# Patient Record
Sex: Male | Born: 1975
Health system: Southern US, Community
[De-identification: ages and names within clinical notes are randomized; demographics above are authoritative.]

## PROBLEM LIST (undated history)

## (undated) DIAGNOSIS — I2510 Atherosclerotic heart disease of native coronary artery without angina pectoris: Secondary | ICD-10-CM

## (undated) DIAGNOSIS — N200 Calculus of kidney: Secondary | ICD-10-CM

## (undated) DIAGNOSIS — E785 Hyperlipidemia, unspecified: Secondary | ICD-10-CM

## (undated) DIAGNOSIS — I10 Essential (primary) hypertension: Secondary | ICD-10-CM

## (undated) DIAGNOSIS — R079 Chest pain, unspecified: Secondary | ICD-10-CM

## (undated) HISTORY — DX: Atherosclerotic heart disease of native coronary artery without angina pectoris: I25.10

## (undated) HISTORY — DX: Essential (primary) hypertension: I10

## (undated) HISTORY — DX: Calculus of kidney: N20.0

## (undated) HISTORY — DX: Chest pain, unspecified: R07.9

## (undated) HISTORY — DX: Hyperlipidemia, unspecified: E78.5

---

## 2018-01-15 ENCOUNTER — Ambulatory Visit (INDEPENDENT_AMBULATORY_CARE_PROVIDER_SITE_OTHER): Payer: BLUE CROSS/BLUE SHIELD

## 2018-01-15 ENCOUNTER — Encounter: Payer: Self-pay | Admitting: Physician Assistant

## 2018-01-15 ENCOUNTER — Ambulatory Visit (INDEPENDENT_AMBULATORY_CARE_PROVIDER_SITE_OTHER): Payer: BLUE CROSS/BLUE SHIELD | Admitting: Physician Assistant

## 2018-01-15 VITALS — BP 110/86 | HR 84 | Temp 98.2°F | Resp 16 | Ht 69.0 in | Wt 231.0 lb

## 2018-01-15 DIAGNOSIS — Z1329 Encounter for screening for other suspected endocrine disorder: Secondary | ICD-10-CM

## 2018-01-15 DIAGNOSIS — Z130 Encounter for screening for diseases of the blood and blood-forming organs and certain disorders involving the immune mechanism: Secondary | ICD-10-CM | POA: Diagnosis not present

## 2018-01-15 DIAGNOSIS — Z13220 Encounter for screening for lipoid disorders: Secondary | ICD-10-CM | POA: Diagnosis not present

## 2018-01-15 DIAGNOSIS — Z0000 Encounter for general adult medical examination without abnormal findings: Secondary | ICD-10-CM

## 2018-01-15 DIAGNOSIS — R079 Chest pain, unspecified: Secondary | ICD-10-CM

## 2018-01-15 LAB — POCT URINALYSIS DIP (MANUAL ENTRY)
Bilirubin, UA: NEGATIVE
Blood, UA: NEGATIVE
Glucose, UA: NEGATIVE mg/dL
Ketones, POC UA: NEGATIVE mg/dL
Leukocytes, UA: NEGATIVE
Nitrite, UA: NEGATIVE
Protein Ur, POC: NEGATIVE mg/dL
Spec Grav, UA: 1.01 (ref 1.010–1.025)
Urobilinogen, UA: 0.2 E.U./dL
pH, UA: 6.5 (ref 5.0–8.0)

## 2018-01-15 NOTE — Patient Instructions (Addendum)
Your EKG and chest x-ray look fine. Please come back if your symptoms worsen.  Your weight today is 231 lbs. I recommend setting a goal to lose 30 lbs. This may help alleviate your symptoms.  We will contact you with the results from today's blood work when they come back.   Please make an appointment to have your teeth cleaned by a dentist.    Thank you for coming in today. I hope you feel we met your needs.  Feel free to call PCP if you have any questions or further requests.  Please consider signing up for MyChart if you do not already have it, as this is a great way to communicate with me.  Best,  Mercer Pod, PA-C   Health Maintenance, Male A healthy lifestyle and preventive care is important for your health and wellness. Ask your health care provider about what schedule of regular examinations is right for you. What should I know about weight and diet? Eat a Healthy Diet  Eat plenty of vegetables, fruits, whole grains, low-fat dairy products, and lean protein.  Do not eat a lot of foods high in solid fats, added sugars, or salt.  Maintain a Healthy Weight Regular exercise can help you achieve or maintain a healthy weight. You should:  Do at least 150 minutes of exercise each week. The exercise should increase your heart rate and make you sweat (moderate-intensity exercise).  Do strength-training exercises at least twice a week.  Watch Your Levels of Cholesterol and Blood Lipids  Have your blood tested for lipids and cholesterol e very 5 years starting at 42 years of age. If you are at high risk for heart disease, you should start having your blood tested when you are 42 years old. You may need to have your cholesterol levels checked more often if: ? Your lipid or cholesterol levels are high. ? You are older than 42 years of age. ? You are at high risk for heart disease.  What should I know about cancer screening? Many types of cancers can be detected early and may often  be prevented. Lung Cancer  You should be screened every year for lung cancer if: ? You are a current smoker who has smoked for at least 30 years. ? You are a former smoker who has quit within the past 15 years.  Talk to your health care provider about your screening options, when you should start screening, and how often you should be screened.  Colorectal Cancer  Routine colorectal cancer screening usually begins at 42 years of age and should be repeated every 5-10 years until you are 42 years old. You may need to be screened more often if early forms of precancerous polyps or small growths are found. Your health care provider may recommend screening at an earlier age if you have risk factors for colon cancer.  Your health care provider may recommend using home test kits to check for hidden blood in the stool.  A small camera at the end of a tube can be used to examine your colon (sigmoidoscopy or colonoscopy). This checks for the earliest forms of colorectal cancer.  Prostate and Testicular Cancer  Depending on your age and overall health, your health care provider may do certain tests to screen for prostate and testicular cancer.  Talk to your health care provider about any symptoms or concerns you have about testicular or prostate cancer.  Skin Cancer  Check your skin from head to toe regularly.  Tell your health care provider about any new moles or changes in moles, especially if: ? There is a change in a mole's size, shape, or color. ? You have a mole that is larger than a pencil eraser.  Always use sunscreen. Apply sunscreen liberally and repeat throughout the day.  Protect yourself by wearing long sleeves, pants, a wide-brimmed hat, and sunglasses when outside.  What should I know about heart disease, diabetes, and high blood pressure?  If you are 80-74 years of age, have your blood pressure checked every 3-5 years. If you are 58 years of age or older, have your blood  pressure checked every year. You should have your blood pressure measured twice-once when you are at a hospital or clinic, and once when you are not at a hospital or clinic. Record the average of the two measurements. To check your blood pressure when you are not at a hospital or clinic, you can use: ? An automated blood pressure machine at a pharmacy. ? A home blood pressure monitor.  Talk to your health care provider about your target blood pressure.  If you are between 22-45 years old, ask your health care provider if you should take aspirin to prevent heart disease.  Have regular diabetes screenings by checking your fasting blood sugar level. ? If you are at a normal weight and have a low risk for diabetes, have this test once every three years after the age of 60. ? If you are overweight and have a high risk for diabetes, consider being tested at a younger age or more often.  A one-time screening for abdominal aortic aneurysm (AAA) by ultrasound is recommended for men aged 39-75 years who are current or former smokers. What should I know about preventing infection? Hepatitis B If you have a higher risk for hepatitis B, you should be screened for this virus. Talk with your health care provider to find out if you are at risk for hepatitis B infection. Hepatitis C Blood testing is recommended for:  Everyone born from [birth year] through 1964.  Anyone with known risk factors for hepatitis C.  Sexually Transmitted Diseases (STDs)  You should be screened each year for STDs including gonorrhea and chlamydia if: ? You are sexually active and are younger than 42 years of age. ? You are older than 42 years of age and your health care provider tells you that you are at risk for this type of infection. ? Your sexual activity has changed since you were last screened and you are at an increased risk for chlamydia or gonorrhea. Ask your health care provider if you are at risk.  Talk with your health  care provider about whether you are at high risk of being infected with HIV. Your health care provider may recommend a prescription medicine to help prevent HIV infection.  What else can I do?  Schedule regular health, dental, and eye exams.  Stay current with your vaccines (immunizations).  Do not use any tobacco products, such as cigarettes, chewing tobacco, and e-cigarettes. If you need help quitting, ask your health care provider.  Limit alcohol intake to no more than 2 drinks per day. One drink equals 12 ounces of beer, 5 ounces of wine, or 1 ounces of hard liquor.  Do not use street drugs.  Do not share needles.  Ask your health care provider for help if you need support or information about quitting drugs.  Tell your health care provider if you often feel depressed.  Tell your health care provider if you have ever been abused or do not feel safe at home. This information is not intended to replace advice given to you by your health care provider. Make sure you discuss any questions you have with your health care provider. Document Released: 06/14/2008 Document Revised: 08/15/2016 Document Reviewed: 09/20/2015 Elsevier Interactive Patient Education  2018 [employer].  IF you received an x-ray today, you will receive an invoice from [facility]. Please contact [facility] at [phone] with questions or concerns regarding your invoice.   IF you received labwork today, you will receive an invoice from [facility]. Please contact [facility] at [phone] with questions or concerns regarding your invoice.   Our billing staff will not be able to assist you with questions regarding bills from these companies.  You will be contacted with the lab results as soon as they are available. The fastest way to get your results is to activate your My Chart account. Instructions are located on the last page of this paperwork. If you have not heard from [place] regarding the results  in 2 weeks, please contact this office.

## 2018-01-15 NOTE — Progress Notes (Signed)
[facility] [place], [place] [address] [phone]  Date:  01/15/2018   Name:  Leonard Henry   DOB:  [birth year]   MRN:  [id number]  PCP:  Patient, No Pcp Per    Chief Complaint: Annual Exam   History of Present Illness:  This is a 43 y.o. male with no reported PMH who is presenting for CPE. Never had a physical exam before. He works in [employer].   Complaints:  Chest pain x several years. Sometimes accompanied with left shoulder pain. Pain is currently present. Pain is located below left breast. Sometimes has shob with exertion. He has had an EKG several years ago for same symptoms, which was negative.  Immunizations: needs tdap Dentist: does not see dentist.  Eye: 20/20 b/l Diet: mostly vegetables, Indian breads. Once a week eats meat.  Exercise: does not exercise.  Fam hx: mother heart disease, HLD, DM; father heart disease,  Sexual hx: married Urinary hesitancy/frequency or nocturia: none Tobacco/alcohol/substance use: never smoker, does not drink alcohol or use drugs.   Review of Systems:  Review of Systems  Constitutional: Negative for activity change, appetite change, chills, diaphoresis and fatigue.  HENT: Negative for congestion, dental problem, sneezing and tinnitus.   Eyes: Negative for visual disturbance.  Respiratory: Positive for chest tightness and shortness of breath. Negative for cough and wheezing.   Cardiovascular: Positive for chest pain. Negative for palpitations and leg swelling.  Gastrointestinal: Negative for abdominal pain, blood in stool, constipation, diarrhea, nausea and vomiting.  Endocrine: Negative for polydipsia, polyphagia and polyuria.  Genitourinary: Negative for decreased urine volume, difficulty urinating, discharge, hematuria, scrotal swelling and testicular pain.  Musculoskeletal: Negative for arthralgias, back pain, neck pain and neck stiffness.  Allergic/Immunologic: Negative for environmental allergies and food  allergies.  Neurological: Negative for dizziness, syncope, weakness, light-headedness and headaches.  Psychiatric/Behavioral: Negative for sleep disturbance. The patient is not nervous/anxious.     There are no active problems to display for this patient.   Prior to Admission medications   Not on File    Allergies not on file  Social History   Tobacco Use  . Smoking status: Never Smoker  . Smokeless tobacco: Never Used  Substance Use Topics  . Alcohol use: Yes  . Drug use: No    Family History  Problem Relation Age of Onset  . Diabetes Mother   . Heart disease Mother   . Hyperlipidemia Mother   . Heart disease Father     Medication list has been reviewed and updated.  Physical Examination:  Physical Exam  Constitutional: He is oriented to person, place, and time. Vital signs are normal. He appears well-developed and well-nourished. No distress.  obese  HENT:  Head: Normocephalic and atraumatic.  Right Ear: External ear normal.  Left Ear: External ear normal.  Nose: Nose normal.  Mouth/Throat: No oropharyngeal exudate.  Eyes: Conjunctivae and EOM are normal. Pupils are equal, round, and reactive to light.  Neck: Normal range of motion. No thyromegaly present.  Cardiovascular: Normal rate, regular rhythm, S1 normal, S2 normal, normal heart sounds and intact distal pulses.  No murmur heard. Distant heart sounds.   Pulmonary/Chest: Effort normal and breath sounds normal. No respiratory distress. He has no wheezes. He exhibits no mass, no tenderness, no bony tenderness and no deformity.  Mildly prominent breast tissue b/l.  Chest is not TTP.   Abdominal: Soft. Bowel sounds are normal. He exhibits no distension and no mass. There is no tenderness.  Central obesity  Musculoskeletal: Normal range of motion. He exhibits no edema.  Lymphadenopathy:    He has no cervical adenopathy.  Neurological: He is alert and oriented to person, place, and time. He has normal  reflexes.  Skin: Skin is warm and dry.  Psychiatric: He has a normal mood and affect. His behavior is normal. Judgment and thought content normal.  Vitals reviewed.   BP 110/86   Pulse 84   Temp 98.2 F (36.8 C) (Oral)   Resp 16   Ht _0  (1.753 m)   Wt 231 lb (104.8 kg)   SpO2 97%   BMI 34.11 kg/m   Dg Chest 2 View  Result Date: 01/15/2018 CLINICAL DATA:  Chest pain for 1 year EXAM: CHEST  2 VIEW COMPARISON:  None. FINDINGS: Mild opacity at the left cardiac apex. No consolidation or effusion. Low lung volumes. Normal heart size. No pneumothorax IMPRESSION: Minimal opacity at the cardiac apex could be due to atelectasis, scar, or prominent fat pad. Low lung volumes. No definite radiographic evidence for acute cardiopulmonary abnormality. Electronically Signed   By: Donavan Foil M.D.   On: 01/15/2018 17:25    EKG reviewed by Dr. Brigitte Pulse. Shows low voltage of precordial waves. No ST abnormalities.  Assessment and Plan: 1. Annual physical exam - Pt presents for first ever annual exam. C/o chest pain x 1 year. No concerning findings on EKG. Chest x-ray +low lung volumes, no other abnormalities. Advised weight loss. Anticipatory guidance provided. Routine labs are pending.   2. Screening, lipid - Lipid panel  3. Screening for deficiency anemia - CBC with Differential/Platelet  4. Screening for endocrine disorder - CMP14+EGFR - POCT urinalysis dipstick  5. Chest pain, unspecified type - EKG 12-Lead - DG Chest 2 View; Future - Check Peak Flow - EKG is not concerning today. Peak flow is 532. Pt requests cardiac referral as he has been having chest pain x several years.   Mercer Pod, PA-C  [facility] [place] 01/15/2018 4:06 PM

## 2018-01-16 LAB — CBC WITH DIFFERENTIAL/PLATELET
Basophils Absolute: 0.1 10*3/uL (ref 0.0–0.2)
Basos: 1 %
EOS (ABSOLUTE): 0.3 10*3/uL (ref 0.0–0.4)
Eos: 4 %
Hematocrit: 44.6 % (ref 37.5–51.0)
Hemoglobin: 15.3 g/dL (ref 13.0–17.7)
Immature Grans (Abs): 0 10*3/uL (ref 0.0–0.1)
Immature Granulocytes: 0 %
Lymphocytes Absolute: 2.4 10*3/uL (ref 0.7–3.1)
Lymphs: 33 %
MCH: 29 pg (ref 26.6–33.0)
MCHC: 34.3 g/dL (ref 31.5–35.7)
MCV: 85 fL (ref 79–97)
Monocytes Absolute: 0.5 10*3/uL (ref 0.1–0.9)
Monocytes: 7 %
Neutrophils Absolute: 4.2 10*3/uL (ref 1.4–7.0)
Neutrophils: 55 %
Platelets: 249 10*3/uL (ref 150–379)
RBC: 5.27 x10E6/uL (ref 4.14–5.80)
RDW: 14.4 % (ref 12.3–15.4)
WBC: 7.4 10*3/uL (ref 3.4–10.8)

## 2018-01-16 LAB — CMP14+EGFR
ALT: 21 IU/L (ref 0–44)
AST: 23 IU/L (ref 0–40)
Albumin/Globulin Ratio: 1.6 (ref 1.2–2.2)
Albumin: 4.3 g/dL (ref 3.5–5.5)
Alkaline Phosphatase: 72 IU/L (ref 39–117)
BUN/Creatinine Ratio: 11 (ref 9–20)
BUN: 10 mg/dL (ref 6–24)
Bilirubin Total: 0.7 mg/dL (ref 0.0–1.2)
CO2: 21 mmol/L (ref 20–29)
Calcium: 9.9 mg/dL (ref 8.7–10.2)
Chloride: 105 mmol/L (ref 96–106)
Creatinine, Ser: 0.94 mg/dL (ref 0.76–1.27)
GFR calc Af Amer: 115 mL/min/{1.73_m2} (ref >59)
GFR calc non Af Amer: 100 mL/min/{1.73_m2} (ref >59)
Globulin, Total: 2.7 g/dL (ref 1.5–4.5)
Glucose: 108 mg/dL — ABNORMAL HIGH (ref 65–99)
Potassium: 4.9 mmol/L (ref 3.5–5.2)
Sodium: 141 mmol/L (ref 134–144)
Total Protein: 7 g/dL (ref 6.0–8.5)

## 2018-01-16 LAB — LIPID PANEL
Chol/HDL Ratio: 4.9 ratio (ref 0.0–5.0)
Cholesterol, Total: 199 mg/dL (ref 100–199)
HDL: 41 mg/dL (ref >39)
LDL Calculated: 131 mg/dL — ABNORMAL HIGH (ref 0–99)
Triglycerides: 134 mg/dL (ref 0–149)
VLDL Cholesterol Cal: 27 mg/dL (ref 5–40)

## 2018-01-20 NOTE — Progress Notes (Signed)
Please call pt and let him know his labs look great! His blood counts, kidney and liver function are good. His LDL (bad) cholesterol is a little elevated. This can be corrected through improvements in his diet and exercise. I recommend that the patient eat healthier, cut down on fatty foods, fried foods, limit red meat to once a week, eat balanced meals including a serving of vegetables and/or fruits with every meal. Patient should also start exercising at least 2-3 times per week to help with cholesterol. Recheck fasting labs in one year. Thank you!

## 2018-01-21 ENCOUNTER — Telehealth: Payer: Self-pay | Admitting: Student

## 2018-01-21 NOTE — Telephone Encounter (Signed)
Copied from [place]. Topic: Quick Communication - See Telephone Encounter >> Jan 21, 2018  3:45 PM Boyd Kerbs wrote: CRM for notification. See Telephone encounter for:   Patient called wanting lab results, no CRM in and not received a call yet  01/21/18.

## 2018-01-23 NOTE — Telephone Encounter (Signed)
Pt given results yesterday

## 2018-02-10 ENCOUNTER — Encounter: Payer: Self-pay | Admitting: Physician Assistant

## 2018-02-10 NOTE — Progress Notes (Signed)
Cardiology Office Note    Date:  02/11/2018   ID:  Leonard Henry, DOB September 06, 1975, MRN [id number]  PCP:  Patient, No Pcp Per  Cardiologist:  New  Chief Complaint: Chest pain   History of Present Illness:   Leonard Henry is a 43 y.o. male with no PMH referred by Dorise Hiss, PA-C for chest pain.   The patient for seen by PCP 01/15/18 to establish care and complete physical (first). Complained of chest pain x several years. He works in Architect. EKG at PCP office showed NSR without any ischemic changes.  Kidney function and electrolytes were normal. LDL 131.  Strong family history of CAD.  Father died of MI at age 33.  Mother had a stenting in her early 85s.  He describes her chest pain as pressure on left-sided chest.  Radiating to left arm.  No associated diaphoresis, shortness of breath, nausea or vomiting.  This been ongoing for years.  Worsening past 2 months.  He works as a Customer service manager of houses.  He has noted symptoms with and without exertion.  No exacerbation by movement or lifting.  His symptoms occur spontaneously and resolves within a few minutes to hours.  Now more intense and severe.  No alcohol drinking.  Denies tobacco smoking.  Past Medical History:  Diagnosis Date  . Chest pain    Current Medications: Prior to Admission medications   Not on File  Currently does not take any medication.  Allergies:   Patient has no allergy information on record.   Social History   Socioeconomic History  . Marital status: Married    Spouse name: None  . Number of children: None  . Years of education: None  . Highest education level: None  Social Needs  . Financial resource strain: None  . Food insecurity - worry: None  . Food insecurity - inability: None  . Transportation needs - medical: None  . Transportation needs - non-medical: None  Occupational History  . None  Tobacco Use  . Smoking status: Never Smoker  . Smokeless tobacco: Never Used    Substance and Sexual Activity  . Alcohol use: Yes  . Drug use: No  . Sexual activity: None  Other Topics Concern  . None  Social History Narrative  . None     Family History:  The patient's family history includes Diabetes in his mother; Heart disease in his father and mother; Hyperlipidemia in his mother.   ROS:   Please see the history of present illness.    ROS All other systems reviewed and are negative.   PHYSICAL EXAM:   VS:  BP 112/70   Pulse 64   Ht 5\' 10"  (1.778 m)   Wt 232 lb 12.8 oz (105.6 kg)   BMI 33.40 kg/m    GEN: Well nourished, well developed, in no acute distress  HEENT: normal  Neck: no JVD, carotid bruits, or masses Cardiac: RRR; no murmurs, rubs, or gallops,no edema  Respiratory:  clear to auscultation bilaterally, normal work of breathing GI: soft, nontender, nondistended, + BS MS: no deformity or atrophy  Skin: warm and dry, no rash Neuro:  Alert and Oriented x 3, Strength and sensation are intact Psych: euthymic mood, full affect  Wt Readings from Last 3 Encounters:  02/11/18 232 lb 12.8 oz (105.6 kg)  01/15/18 231 lb (104.8 kg)      Studies/Labs Reviewed:   EKG:  EKG is ordered today.   Recent Labs: 01/15/2018: ALT  21; BUN 10; Creatinine, Ser 0.94; Hemoglobin 15.3; Platelets 249; Potassium 4.9; Sodium 141   Lipid Panel    Component Value Date/Time   CHOL 199 01/15/2018 1741   TRIG 134 01/15/2018 1741   HDL 41 01/15/2018 1741   CHOLHDL 4.9 01/15/2018 1741   LDLCALC 131 (H) 01/15/2018 1741    Additional studies/ records that were reviewed today include:   As summarized above    ASSESSMENT & PLAN:    1. Chest pain -Ongoing for past few years, recently worsening past few months.  Occurs with and without exertion itself consideration.  His cardiac risk factors include strong family history of CAD.  No history of diabetes or hypertension.  Minimally elevated LDL. -Reviewed with DOD Dr. Acie Fredrickson.  Start aspirin 81 mg and Lipitor  20.  Will get coronary CTA.    Medication Adjustments/Labs and Tests Ordered: Current medicines are reviewed at length with the patient today.  Concerns regarding medicines are outlined above.  Medication changes, Labs and Tests ordered today are listed in the Patient Instructions below. Patient Instructions  Your physician has recommended you make the following change in your medication:  START ASPIRIN  81 MG EVERY DAY  ATORVASTATIN 20 MG EVERY EVENING   Please arrive at the [facility] main entrance of [facility] at xx:xx AM (30-45 minutes prior to test start time)  [facility] [place], [place] [address] [phone]  Proceed to the [facility] Radiology Department (First Floor).  Please follow these instructions carefully (unless otherwise directed):  Hold all erectile dysfunction medications at least 48 hours prior to test.  On the Night Before the Test: . Drink plenty of water. . Do not consume any caffeinated/decaffeinated beverages or chocolate 12 hours prior to your test. . Do not take any antihistamines 12 hours prior to your test. . If you take Metformin do not take 24 hours prior to test. . If the patient has contrast allergy: ? Patient will need a prescription for Prednisone and very clear instructions (as follows): 1. Prednisone 50 mg - take 13 hours prior to test 2. Take another Prednisone 50 mg 7 hours prior to test 3. Take another Prednisone 50 mg 1 hour prior to test 4. Take Benadryl 50 mg 1 hour prior to test . Patient must complete all four doses of above prophylactic medications. . Patient will need a ride after test due to Benadryl.  On the Day of the Test: . Drink plenty of water. Do not drink any water within one hour of the test. . Do not eat any food 4 hours prior to the test. . You may take your regular medications prior to the test. . IF NOT ON A BETA BLOCKER - Take 50 mg of lopressor (metoprolol) one hour  before the test. . HOLD Furosemide morning of the test.  After the Test: . Drink plenty of water. . After receiving IV contrast, you may experience a mild flushed feeling. This is normal. . On occasion, you may experience a mild rash up to 24 hours after the test. This is not dangerous. If this occurs, you can take Benadryl 25 mg and increase your fluid intake. . If you experience trouble breathing, this can be serious. If it is severe call 911 IMMEDIATELY. If it is mild, please call our office. . If you take any of these medications: Glipizide/Metformin, Avandament, Glucavance, please do not take 48 hours after completing test.  Your physician recommends that you  schedule a follow-up appointment in:  AS NEEDED    Jaciel, Diem, PA  02/11/2018 3:09 PM    [facility] [place], [place], [place]  [address] Phone: [phone]; Fax: [phone]

## 2018-02-11 ENCOUNTER — Ambulatory Visit: Payer: BLUE CROSS/BLUE SHIELD | Admitting: Physician Assistant

## 2018-02-11 ENCOUNTER — Encounter: Payer: Self-pay | Admitting: Physician Assistant

## 2018-02-11 ENCOUNTER — Other Ambulatory Visit: Payer: Self-pay | Admitting: *Deleted

## 2018-02-11 VITALS — BP 112/70 | HR 64 | Ht 70.0 in | Wt 232.8 lb

## 2018-02-11 DIAGNOSIS — R079 Chest pain, unspecified: Secondary | ICD-10-CM | POA: Diagnosis not present

## 2018-02-11 DIAGNOSIS — E785 Hyperlipidemia, unspecified: Secondary | ICD-10-CM

## 2018-02-11 MED ORDER — ATORVASTATIN CALCIUM 20 MG PO TABS: 20.0000 mg | ORAL_TABLET | Freq: Every day | ORAL | 3 refills | Status: DC

## 2018-02-11 MED ORDER — METOPROLOL TARTRATE 50 MG PO TABS: 50.0000 mg | ORAL_TABLET | Freq: Once | ORAL | 0 refills | Status: DC

## 2018-02-11 MED ORDER — METOPROLOL SUCCINATE ER 50 MG PO TB24: 50.0000 mg | ORAL_TABLET | Freq: Once | ORAL | 0 refills | Status: DC | PRN

## 2018-02-11 NOTE — Patient Instructions (Addendum)
Your physician has recommended you make the following change in your medication:  START ASPIRIN  81 MG EVERY DAY  ATORVASTATIN 20 MG EVERY EVENING   Please arrive at the [facility] main entrance of [facility] at xx:xx AM (30-45 minutes prior to test start time)  [facility] [place], [place] [address] [phone]  Proceed to the [facility] Radiology Department (First Floor).  Please follow these instructions carefully (unless otherwise directed):  Hold all erectile dysfunction medications at least 48 hours prior to test.  On the Night Before the Test: . Drink plenty of water. . Do not consume any caffeinated/decaffeinated beverages or chocolate 12 hours prior to your test. . Do not take any antihistamines 12 hours prior to your test. . If you take Metformin do not take 24 hours prior to test. . If the patient has contrast allergy: ? Patient will need a prescription for Prednisone and very clear instructions (as follows): 1. Prednisone 50 mg - take 13 hours prior to test 2. Take another Prednisone 50 mg 7 hours prior to test 3. Take another Prednisone 50 mg 1 hour prior to test 4. Take Benadryl 50 mg 1 hour prior to test . Patient must complete all four doses of above prophylactic medications. . Patient will need a ride after test due to Benadryl.  On the Day of the Test: . Drink plenty of water. Do not drink any water within one hour of the test. . Do not eat any food 4 hours prior to the test. . You may take your regular medications prior to the test. . IF NOT ON A BETA BLOCKER - Take 50 mg of lopressor (metoprolol) one hour before the test. . HOLD Furosemide morning of the test.  After the Test: . Drink plenty of water. . After receiving IV contrast, you may experience a mild flushed feeling. This is normal. . On occasion, you may experience a mild rash up to 24 hours after the test. This is not dangerous. If this occurs, you can take  Benadryl 25 mg and increase your fluid intake. . If you experience trouble breathing, this can be serious. If it is severe call 911 IMMEDIATELY. If it is mild, please call our office. . If you take any of these medications: Glipizide/Metformin, Avandament, Glucavance, please do not take 48 hours after completing test.  Your physician recommends that you schedule a follow-up appointment in:  AS NEEDED

## 2018-03-11 ENCOUNTER — Ambulatory Visit (HOSPITAL_COMMUNITY)
Admission: RE | Admit: 2018-03-11 | Discharge: 2018-03-11 | Disposition: A | Discharge status: Home / Self Care | Payer: BLUE CROSS/BLUE SHIELD | Source: Ambulatory Visit | Attending: Physician Assistant | Admitting: Physician Assistant

## 2018-03-11 ENCOUNTER — Encounter (HOSPITAL_COMMUNITY): Payer: Self-pay

## 2018-03-11 ENCOUNTER — Ambulatory Visit (HOSPITAL_COMMUNITY): Payer: BLUE CROSS/BLUE SHIELD

## 2018-03-11 DIAGNOSIS — R079 Chest pain, unspecified: Secondary | ICD-10-CM | POA: Diagnosis not present

## 2018-03-11 MED ORDER — METOPROLOL TARTRATE 5 MG/5ML IV SOLN
INTRAVENOUS | Status: AC
  Administered 2018-03-11: 5 mg via INTRAVENOUS
  Filled 2018-03-11: qty 15

## 2018-03-11 MED ORDER — NITROGLYCERIN 0.4 MG SL SUBL: 0.8000 mg | SUBLINGUAL_TABLET | SUBLINGUAL | Status: DC | PRN

## 2018-03-11 MED ORDER — NITROGLYCERIN 0.4 MG SL SUBL
SUBLINGUAL_TABLET | SUBLINGUAL | Status: AC
  Administered 2018-03-11: 0.8 mg
  Filled 2018-03-11: qty 2

## 2018-03-11 MED ORDER — METOPROLOL TARTRATE 5 MG/5ML IV SOLN
5.0000 mg | INTRAVENOUS | Status: AC
  Administered 2018-03-11 (×3): 5 mg via INTRAVENOUS

## 2018-03-11 MED ORDER — IOPAMIDOL (ISOVUE-370) INJECTION 76%
INTRAVENOUS | Status: AC
  Administered 2018-03-11: 80 mL
  Filled 2018-03-11: qty 100

## 2018-03-11 NOTE — Progress Notes (Signed)
Pt has been made aware of normal result and verbalized understanding.  jw 03/11/18

## 2018-03-11 NOTE — Progress Notes (Signed)
Pt had vomiting immediately after Contrast Injection

## 2018-03-11 NOTE — Progress Notes (Signed)
After CT scan of heart complete, patient vomited large amount of solid food. Approximately 5 minutes after vomiting episode patient complaining of generalized itching. No hives or skin redness present. Patient denies shortness of breath/difficulty breathing. Vital signs stable (BP 104/84, HR 61 NSR, 98% on RA, RR 16). Dr. Candise Che notified and assessed patient in IR holding room area. Patient states that nausea/vomiting and itching have resolved. Patient & wife updated and educated in room by MD and staff. Iopamidol listed in patient chart as allergy, patient and wife both aware and state understanding of allergy.

## 2018-04-21 ENCOUNTER — Ambulatory Visit: Payer: BLUE CROSS/BLUE SHIELD | Admitting: Family Medicine

## 2018-04-22 ENCOUNTER — Ambulatory Visit: Payer: BLUE CROSS/BLUE SHIELD | Admitting: Physician Assistant

## 2018-04-22 ENCOUNTER — Encounter: Payer: Self-pay | Admitting: Physician Assistant

## 2018-04-22 ENCOUNTER — Other Ambulatory Visit: Payer: Self-pay

## 2018-04-22 VITALS — BP 110/72 | HR 77 | Temp 98.7°F | Resp 18 | Ht 70.0 in | Wt 235.6 lb

## 2018-04-22 DIAGNOSIS — M79671 Pain in right foot: Secondary | ICD-10-CM | POA: Diagnosis not present

## 2018-04-22 DIAGNOSIS — M722 Plantar fascial fibromatosis: Secondary | ICD-10-CM | POA: Insufficient documentation

## 2018-04-22 DIAGNOSIS — M79672 Pain in left foot: Secondary | ICD-10-CM

## 2018-04-22 MED ORDER — MELOXICAM 15 MG PO TABS: 15.0000 mg | ORAL_TABLET | Freq: Every day | ORAL | 2 refills | Status: DC

## 2018-04-22 NOTE — Patient Instructions (Addendum)
Start taking meloxicam daily. Do not use with any other otc pain medication other than tylenol/acetaminophen - so no aleve, ibuprofen, motrin, advil, etc.   Plantar fasciitis One of the most common causes of heel pain is a problem called "plantar fasciitis." Plantar fasciitis is the term doctors use when a part of the foot called the plantar fascia gets irritated or swollen. The plantar fascia is a tough band of tissue that connects the heel bone to the toes  Initial therapy-Treatment of obesity, symptomatic flat feet, and systemic inflammation should be undertaken when these conditions are present. Otherwise, treatment should begin with conservative therapy including measures to relieve pain, alterations in shoes or habits, and exercise therapy.  General approach to therapy: - The first step out of bed should be made wearing a supportive shoe or sandal. - If you work or reside in buildings with concrete floors should use cushion- or crepe-soled shoes. ?Performing of stretching exercises for the plantar fascia and calf muscles (see attached). ?Avoiding the use of flat shoes and barefoot walking.  ?Using prefabricated, over-the-counter, silicone heel shoe inserts (arch supports and/or heel cups). ?Decreasing physical activities that are suggested by the medical history to be causative or aggravating (eg, excessive running, dancing, or jumping). ?Prescribing or recommending a short-term trial (two to three weeks) of nonsteroidal antiinflammatory drugs (NSAIDs). Use of NSAIDs is reasonable, but their long-term use should be reserved for patients with known systemic rheumatic disease. ?Molded shoe inserts (orthotics) ?Night splints - Some people feel better if they wear a splint while they sleep that keeps their foot straight. These splints are sold in drugstores and medical supply stores. ?Rest and icing may give initial pain relief. ?Wearing slippers or going barefoot may aggravate symptoms or may  result in a recurrence of symptoms. Thus, the first step out of bed should be made wearing a supportive shoe or sandal. ?Patients who work or reside in buildings with concrete floors should use cushion- or crepe-soled shoes. Excessive heel impact from jumping or during walking should be avoided. ?Athletic shoes, arch-supporting shoes (particularly those with an extra-long counter, which is the firm part of the shoe that surrounds the heel), or shoes with rigid shanks (usually a metal insert into the sole of the shoe) may be helpful. Shoes with these features can be found in stores featuring work shoes or "orthopedic shoes." ?Exercises may be beneficial, although evidence is limited. Home exercises include plantar and calf-plantar fascia stretches, foot-ankle circles, toe curls, toe towel curls, and unilateral heel raises with toe dorsiflexion.   Is there anything I can do to keep from getting heel pain again?-Yes. To reduce the chances that your pain will come back: ?Wear shoes that fit well, have a lot of cushion, and support the heel and ankle ?Avoid wearing slippers, flip-flops, slip-ons, or poorly fitted shoes ?Avoid going barefoot ?Do not wear worn-out shoes  Come back and see me in 2-3 months if your pain is not improving.   Plantar Fasciitis Plantar fasciitis is a painful foot condition that affects the heel. It occurs when the band of tissue that connects the toes to the heel bone (plantar fascia) becomes irritated. This can happen after exercising too much or doing other repetitive activities (overuse injury). The pain from plantar fasciitis can range from mild irritation to severe pain that makes it difficult for you to walk or move. The pain is usually worse in the morning or after you have been sitting or lying down for a while. What  are the causes? This condition may be caused by:  Standing for long periods of time.  Wearing shoes that do not fit.  Doing high-impact activities,  including running, aerobics, and ballet.  Being overweight.  Having an abnormal way of walking (gait).  Having tight calf muscles.  Having high arches in your feet.  Starting a new athletic activity.  What are the signs or symptoms? The main symptom of this condition is heel pain. Other symptoms include:  Pain that gets worse after activity or exercise.  Pain that is worse in the morning or after resting.  Pain that goes away after you walk for a few minutes.  How is this diagnosed? This condition may be diagnosed based on your signs and symptoms. Your health care provider will also do a physical exam to check for:  A tender area on the bottom of your foot.  A high arch in your foot.  Pain when you move your foot.  Difficulty moving your foot.  You may also need to have imaging studies to confirm the diagnosis. These can include:  X-rays.  Ultrasound.  MRI.  How is this treated? Treatment for plantar fasciitis depends on the severity of the condition. Your treatment may include:  Rest, ice, and over-the-counter pain medicines to manage your pain.  Exercises to stretch your calves and your plantar fascia.  A splint that holds your foot in a stretched, upward position while you sleep (night splint).  Physical therapy to relieve symptoms and prevent problems in the future.  Cortisone injections to relieve severe pain.  Extracorporeal shock wave therapy (ESWT) to stimulate damaged plantar fascia with electrical impulses. It is often used as a last resort before surgery.  Surgery, if other treatments have not worked after 12 months.  Follow these instructions at home:  Take medicines only as directed by your health care provider.  Avoid activities that cause pain.  Roll the bottom of your foot over a bag of ice or a bottle of cold water. Do this for 20 minutes, 3-4 times a day.  Perform simple stretches as directed by your health care provider.  Try  wearing athletic shoes with air-sole or gel-sole cushions or soft shoe inserts.  Wear a night splint while sleeping, if directed by your health care provider.  Keep all follow-up appointments with your health care provider. How is this prevented?  Do not perform exercises or activities that cause heel pain.  Consider finding low-impact activities if you continue to have problems.  Lose weight if you need to. The best way to prevent plantar fasciitis is to avoid the activities that aggravate your plantar fascia. Contact a health care provider if:  Your symptoms do not go away after treatment with home care measures.  Your pain gets worse.  Your pain affects your ability to move or do your daily activities. This information is not intended to replace advice given to you by your health care provider. Make sure you discuss any questions you have with your health care provider. Document Released: 09/11/2001 Document Revised: 05/21/2016 Document Reviewed: 10/27/2014 Elsevier Interactive Patient Education  2018 [employer].   IF you received an x-ray today, you will receive an invoice from [facility]. Please contact [facility] at [phone] with questions or concerns regarding your invoice.   IF you received labwork today, you will receive an invoice from [facility]. Please contact [facility] at [phone] with questions or concerns regarding your invoice.   Our billing staff will not  be able to assist you with questions regarding bills from these companies.  You will be contacted with the lab results as soon as they are available. The fastest way to get your results is to activate your My Chart account. Instructions are located on the last page of this paperwork. If you have not heard from [place] regarding the results in 2 weeks, please contact this office.

## 2018-04-22 NOTE — Progress Notes (Signed)
   Leonard Henry  MRN: [id number] DOB: 05-07-1975  PCP: Patient, No Pcp Per  Subjective:  Pt is a 43 year old male who presents to clinic for foot problems x 2-3 years. Pain is of the bottom of both feet between the arch and MTP joint.   He cannot walk without shoes or socks. Numbness to all toes. Pain is worsening. R>L. Nothing makes it feel better. Pain is worse in the morning when he puts his feet on the ground. Worse with walking. He has not taken anything to feel better. He has never had this problem before. He works Architect.  ROS below.    has a past medical history of Chest pain.  Review of Systems  Musculoskeletal: Positive for arthralgias and gait problem. Negative for joint swelling.  Skin: Negative.   Neurological: Positive for numbness (toes). Negative for weakness.    There are no active problems to display for this patient.   Current Outpatient Medications on File Prior to Visit  Medication Sig Dispense Refill  . aspirin EC 81 MG tablet Take 81 mg by mouth daily.    [name] atorvastatin (LIPITOR) 20 MG tablet Take 1 tablet (20 mg total) by mouth daily. 90 tablet 3  . metoprolol tartrate (LOPRESSOR) 50 MG tablet Take 1 tablet (50 mg total) by mouth once for 1 dose. 1 tablet 0   No current facility-administered medications on file prior to visit.     Allergies  Allergen Reactions  . Iopamidol Itching and Nausea And Vomiting     Objective:  BP 110/72   Pulse 77   Temp 98.7 F (37.1 C) (Oral)   Resp 18   Ht 5\' 10"  (1.778 m)   Wt 235 lb 9.6 oz (106.9 kg)   SpO2 97%   BMI 33.81 kg/m   Physical Exam  Constitutional: He is oriented to person, place, and time. He appears well-developed and well-nourished.  Musculoskeletal:       Right foot: There is tenderness. There is normal range of motion, no swelling, no crepitus and no deformity.       Left foot: There is tenderness. There is normal range of motion, no swelling, normal capillary refill and no deformity.  TTP  of plantar aspect of b/l feet with feet held in dorsiflexion.    Neurological: He is alert and oriented to person, place, and time.  Skin: Skin is warm and dry.  Psychiatric: He has a normal mood and affect. His behavior is normal. Judgment and thought content normal.  Vitals reviewed.   Assessment and Plan :  1. Plantar fasciitis, bilateral 2. Foot pain, bilateral - meloxicam (MOBIC) 15 MG tablet; Take 1 tablet (15 mg total) by mouth daily.  Dispense: 30 tablet; Refill: 2 - Pt presents c/o b/l foot pain x >1 year. HPI and PE suggest plantar fasciitis. Silicone heel cups placed in shoes by CMA. Supportive care discussed at length with pt. F/u in 2-3 months if no improvement. Consider ortho or sports medicine referral. He agrees with plan.   Mercer Pod, PA-C  [facility] [place] 04/22/2018 5:25 PM

## 2018-05-13 ENCOUNTER — Other Ambulatory Visit: Payer: Self-pay

## 2018-05-13 ENCOUNTER — Encounter: Payer: Self-pay | Admitting: Physician Assistant

## 2018-05-13 ENCOUNTER — Ambulatory Visit: Payer: BLUE CROSS/BLUE SHIELD | Admitting: Physician Assistant

## 2018-05-13 VITALS — BP 110/82 | HR 70 | Temp 98.0°F | Resp 16 | Ht 69.75 in | Wt 238.6 lb

## 2018-05-13 DIAGNOSIS — M25522 Pain in left elbow: Secondary | ICD-10-CM

## 2018-05-13 DIAGNOSIS — M7712 Lateral epicondylitis, left elbow: Secondary | ICD-10-CM

## 2018-05-13 DIAGNOSIS — M25521 Pain in right elbow: Secondary | ICD-10-CM

## 2018-05-13 DIAGNOSIS — M7711 Lateral epicondylitis, right elbow: Secondary | ICD-10-CM | POA: Diagnosis not present

## 2018-05-13 NOTE — Progress Notes (Signed)
   Leonard Henry  MRN: [id number] DOB: 1975/11/26  PCP: Patient, No Pcp Per  Subjective:  Pt is a right-handed 43 year old male who presents to clinic for b/l elbow pain x 1 month. No MOI. R>L. Pain is of outside, inside and front of both elbows. Twisting motions make it hurt worse. Shaking hands makes it worse. Inciting event: increased use at work.  Patient has had no prior elbow problems. Evaluation to date: none. Treatment to date: nothing specific, he has taken a few doses of Meloxicam the past week or two. Denies muscle weakness, swelling, redness, n/t of fingers or hands.   Review of Systems  Constitutional: Negative for chills and fever.  Musculoskeletal: Positive for arthralgias (b/l elbows). Negative for joint swelling.  Skin: Negative.   Neurological: Negative for weakness and numbness.    Patient Active Problem List   Diagnosis Date Noted  . Plantar fasciitis, bilateral 04/22/2018    Current Outpatient Medications on File Prior to Visit  Medication Sig Dispense Refill  . meloxicam (MOBIC) 15 MG tablet Take 1 tablet (15 mg total) by mouth daily. 30 tablet 2  . atorvastatin (LIPITOR) 20 MG tablet Take 1 tablet (20 mg total) by mouth daily. 90 tablet 3  . metoprolol tartrate (LOPRESSOR) 50 MG tablet Take 1 tablet (50 mg total) by mouth once for 1 dose. 1 tablet 0   No current facility-administered medications on file prior to visit.     Allergies  Allergen Reactions  . Iopamidol Itching and Nausea And Vomiting     Objective:  BP 110/82 (BP Location: Left Arm, Patient Position: Sitting, Cuff Size: Normal)   Pulse 70   Temp 98 F (36.7 C) (Oral)   Resp 16   Ht 5' 9.75" (1.772 m)   Wt 238 lb 9.6 oz (108.2 kg)   SpO2 98%   BMI 34.48 kg/m   Physical Exam  Constitutional: He is oriented to person, place, and time. He appears well-developed and well-nourished.  Musculoskeletal:       Right elbow: He exhibits normal range of motion, no swelling and no effusion.  Tenderness found. Medial epicondyle and lateral epicondyle tenderness noted. No olecranon process tenderness noted.       Left elbow: He exhibits normal range of motion, no swelling, no effusion and no deformity. Tenderness found. Medial epicondyle and lateral epicondyle tenderness noted.  Pain with wrist extension and flexion against resistance.   Neurological: He is alert and oriented to person, place, and time.  Skin: Skin is warm and dry.  Psychiatric: He has a normal mood and affect. His behavior is normal. Judgment and thought content normal.  Vitals reviewed.   Assessment and Plan :  1. Lateral epicondylitis of both elbows 2. Pain of both elbows - pt presents with b/l elbow pain x 1 month, no MOI. Suspect epicondylitis. Plan to treat conservatively with brace, ice, rest and NSAID. RTC in1-2 months if no improvement. Consider sports medicine referral. He understands and agrees with plan.   Mercer Pod, PA-C  [facility] [place] 05/13/2018 11:12 AM

## 2018-05-13 NOTE — Patient Instructions (Addendum)
I am treating you today for tennis elbow (lateral epicondylitis or tendonitis). See attached paper for info.  Continue taking Meloxicam (mobic) for pain- you should have plenty of refills.  Please refer to the stretches and exercises on the following pages.  Go to [facility] [phone], [place], [place] [address]) and buy a tennis elbow brace (make sure they show you how to put it on). You may need to wear this for up to 6 weeks.   If you are not improving in 2 months, come back and I will refer you to sports medicine.  For your feet, try to [employer]   Thank you for coming in today. I hope you feel we met your needs.  Feel free to call PCP if you have any questions or further requests.  Please consider signing up for MyChart if you do not already have it, as this is a great way to communicate with me.  Best,  Whitney McVey, PA-C  IF you received an x-ray today, you will receive an invoice from [facility]. Please contact [facility] at [phone] with questions or concerns regarding your invoice.   IF you received labwork today, you will receive an invoice from [place]. Please contact [facility] at [phone] with questions or concerns regarding your invoice.   Our billing staff will not be able to assist you with questions regarding bills from these companies.  You will be contacted with the lab results as soon as they are available. The fastest way to get your results is to activate your My Chart account. Instructions are located on the last page of this paperwork. If you have not heard from [place] regarding the results in 2 weeks, please contact this office.

## 2019-01-21 ENCOUNTER — Ambulatory Visit (INDEPENDENT_AMBULATORY_CARE_PROVIDER_SITE_OTHER): Payer: BLUE CROSS/BLUE SHIELD

## 2019-01-21 ENCOUNTER — Ambulatory Visit (INDEPENDENT_AMBULATORY_CARE_PROVIDER_SITE_OTHER): Payer: BLUE CROSS/BLUE SHIELD | Admitting: Family Medicine

## 2019-01-21 ENCOUNTER — Encounter: Payer: Self-pay | Admitting: Family Medicine

## 2019-01-21 ENCOUNTER — Other Ambulatory Visit: Payer: Self-pay

## 2019-01-21 VITALS — BP 102/73 | HR 99 | Temp 98.5°F | Resp 16 | Ht 70.0 in | Wt 239.8 lb

## 2019-01-21 DIAGNOSIS — Z8249 Family history of ischemic heart disease and other diseases of the circulatory system: Secondary | ICD-10-CM | POA: Diagnosis not present

## 2019-01-21 DIAGNOSIS — E785 Hyperlipidemia, unspecified: Secondary | ICD-10-CM

## 2019-01-21 DIAGNOSIS — R12 Heartburn: Secondary | ICD-10-CM

## 2019-01-21 DIAGNOSIS — R0789 Other chest pain: Secondary | ICD-10-CM

## 2019-01-21 DIAGNOSIS — Z1329 Encounter for screening for other suspected endocrine disorder: Secondary | ICD-10-CM

## 2019-01-21 DIAGNOSIS — R05 Cough: Secondary | ICD-10-CM | POA: Diagnosis not present

## 2019-01-21 DIAGNOSIS — M7711 Lateral epicondylitis, right elbow: Secondary | ICD-10-CM

## 2019-01-21 DIAGNOSIS — R059 Cough, unspecified: Secondary | ICD-10-CM

## 2019-01-21 MED ORDER — OMEPRAZOLE 20 MG PO CPDR
20.0000 mg | DELAYED_RELEASE_CAPSULE | Freq: Every day | ORAL | 1 refills | Status: DC
Start: 2019-01-21 — End: 2019-03-30

## 2019-01-21 MED ORDER — BENZONATATE 100 MG PO CAPS: 100.0000 mg | ORAL_CAPSULE | Freq: Three times a day (TID) | ORAL | 0 refills | Status: DC | PRN

## 2019-01-21 MED ORDER — TENNIS ELBOW STRAP/GEL PAD MISC: 0 refills | Status: DC

## 2019-01-21 NOTE — Patient Instructions (Addendum)
For chest pressure I referred you back to the cardiologist.  Screening test last year looked okay.  I will check cholesterol test again as those were elevated last year.Return to the clinic or go to the nearest emergency room if any of your symptoms worsen or new symptoms occur.  Cough and heartburn symptoms could also be contributing to chest symptoms. See list of foods to avoid below.  Start omeprazole once per day and follow-up in the next 3 to 4 weeks, sooner if worsening.  Tessalon Perles if needed temporarily up to 3 times per day for cough.  I expect that to improve with treating heartburn symptoms.  Elbow pain still appears to be lateral epicondylitis or tennis elbow.   Apply the brace daily just beyond the area of soreness.  Avoid grasping objects with palm down.  See other information below.  Follow-up in 3 to 4 weeks, and if not improving at that time, I can refer you to a specialist.  Thank you for coming in today.  We can complete a physical at a later visit as acute issues above were addressed today.  Return to the clinic or go to the nearest emergency room if any of your symptoms worsen or new symptoms occur.    Tennis Elbow  Tennis elbow (lateral epicondylitis) is inflammation of tendons in your outer forearm, near your elbow. Tendons are tissues that connect muscle to bone. When you have tennis elbow, inflammation affects the tendons that you use to bend your wrist and move your hand up. Inflammation occurs in the lower part of the upper arm bone (humerus), where the tendons connect to the bone (lateral epicondyle). Tennis elbow often affects people who play tennis, but anyone may get the condition from repeatedly extending the wrist or turning the forearm. What are the causes? This condition is usually caused by repeatedly extending the wrist, turning the forearm, and using the hands. It can result from sports or work that requires repetitive forearm movements. In some cases, it  may be caused by a sudden injury. What increases the risk? You are more likely to develop tennis elbow if you play tennis or another racket sport. You also have a higher risk if you frequently use your hands for work. Besides people who play tennis, others at greater risk include:  Musicians.  Carpenters, painters, and plumbers.  Cooks.  Cashiers.  People who work in [employer].  Architect workers.  Butchers.  People who use computers. What are the signs or symptoms? Symptoms of this condition include:  Pain and tenderness in the forearm and the outer part of the elbow. Pain may be felt only when using the arm, or it may be there all the time.  A burning feeling that starts in the elbow and spreads down the forearm.  A weak grip in the hand. How is this diagnosed? This condition may be diagnosed based on:  Your symptoms and medical history.  A physical exam.  X-rays.  MRI. How is this treated? Resting and icing your arm is often the first treatment. Your health care provider may also recommend:  Medicines to reduce pain and inflammation. These may be in the form of a pill, topical gels, or shots of a steroid medicine (cortisone).  An elbow strap to reduce stress on the area.  Physical therapy. This may include massage or exercises.  An elbow brace to restrict the movements that cause symptoms. If these treatments do not help relieve your symptoms, your health care  provider may recommend surgery to remove damaged muscle and reattach healthy muscle to bone. Follow these instructions at home: Activity  Rest your elbow and wrist and avoid activities that cause symptoms, as told by your health care provider.  Do physical therapy exercises as instructed.  If you lift an object, lift it with your palm facing up. This reduces stress on your elbow. Lifestyle  If your tennis elbow is caused by sports, check your equipment and make sure that: ? You are using it  correctly. ? It is the best fit for you.  If your tennis elbow is caused by work or computer use, take frequent breaks to stretch your arm. Talk with your manager about ways to manage your condition at work. If you have a brace:  Wear the brace or strap as told by your health care provider. Remove it only as told by your health care provider.  Loosen the brace if your fingers tingle, become numb, or turn cold and blue.  Keep the brace clean.  If the brace is not waterproof, ask if you may remove it for bathing. If you must keep the brace on while bathing: ? Do not let it get wet. ? Cover it with a watertight covering when you take a bath or a shower. General instructions   If directed, put ice on the painful area: ? Put ice in a plastic bag. ? Place a towel between your skin and the bag. ? Leave the ice on for 20 minutes, 2-3 times a day.  Take over-the-counter and prescription medicines only as told by your health care provider.  Keep all follow-up visits as told by your health care provider. This is important. Contact a health care provider if:  You have pain that gets worse or does not get better with treatment.  You have numbness or weakness in your forearm, hand, or fingers. Summary  Tennis elbow (lateral epicondylitis) is inflammation of tendons in your outer forearm, near your elbow.  Common symptoms include pain and tenderness in your forearm and the outer part of your elbow.  This condition is usually caused by repeatedly extending your wrist, turning your forearm, and using your hands.  The first treatment is often resting and icing your arm to relieve symptoms. Further treatment may include taking medicine, getting physical therapy, wearing a brace or strap, or having surgery. This information is not intended to replace advice given to you by your health care provider. Make sure you discuss any questions you have with your health care provider. Document Released:  12/17/2005 Document Revised: 10/01/2017 Document Reviewed: 10/01/2017 Elsevier Interactive Patient Education  2019 [place] for Gastroesophageal Reflux Disease, Adult When you have gastroesophageal reflux disease (GERD), the foods you eat and your eating habits are very important. Choosing the right foods can help ease your discomfort. Think about working with a nutrition specialist (dietitian) to help you make good choices. What are tips for following this plan?  Meals  Choose healthy foods that are low in fat, such as fruits, vegetables, whole grains, low-fat dairy products, and lean meat, fish, and poultry.  Eat small meals often instead of 3 large meals a day. Eat your meals slowly, and in a place where you are relaxed. Avoid bending over or lying down until 2-3 hours after eating.  Avoid eating meals 2-3 hours before bed.  Avoid drinking a lot of liquid with meals.  Cook foods using methods other than frying. Bake, grill, or  broil food instead.  Avoid or limit: ? Chocolate. ? Peppermint or spearmint. ? Alcohol. ? Pepper. ? Black and decaffeinated coffee. ? Black and decaffeinated tea. ? Bubbly (carbonated) soft drinks. ? Caffeinated energy drinks and soft drinks.  Limit high-fat foods such as: ? Fatty meat or fried foods. ? Whole milk, cream, butter, or ice cream. ? Nuts and nut butters. ? Pastries, donuts, and sweets made with butter or shortening.  Avoid foods that cause symptoms. These foods may be different for everyone. Common foods that cause symptoms include: ? Tomatoes. ? Oranges, lemons, and limes. ? Peppers. ? Spicy food. ? Onions and garlic. ? Vinegar. Lifestyle  Maintain a healthy weight. Ask your doctor what weight is healthy for you. If you need to lose weight, work with your doctor to do so safely.  Exercise for at least 30 minutes for 5 or more days each week, or as told by your doctor.  Wear loose-fitting clothes.  Do not  smoke. If you need help quitting, ask your doctor.  Sleep with the head of your bed higher than your feet. Use a wedge under the mattress or blocks under the bed frame to raise the head of the bed. Summary  When you have gastroesophageal reflux disease (GERD), food and lifestyle choices are very important in easing your symptoms.  Eat small meals often instead of 3 large meals a day. Eat your meals slowly, and in a place where you are relaxed.  Limit high-fat foods such as fatty meat or fried foods.  Avoid bending over or lying down until 2-3 hours after eating.  Avoid peppermint and spearmint, caffeine, alcohol, and chocolate. This information is not intended to replace advice given to you by your health care provider. Make sure you discuss any questions you have with your health care provider. Document Released: 06/17/2012 Document Revised: 01/22/2017 Document Reviewed: 01/22/2017 Elsevier Interactive Patient Education  2019 [employer]  Nonspecific Chest Pain Chest pain can be caused by many different conditions. Some causes of chest pain can be life-threatening. These will require treatment right away. Serious causes of chest pain include:  Heart attack.  A tear in the body's main blood vessel.  Redness and swelling (inflammation) around your heart.  Blood clot in your lungs. Other causes of chest pain may not be so serious. These include:  Heartburn.  Anxiety or stress.  Damage to bones or muscles in your chest.  Lung infections. Chest pain can feel like:  Pain or discomfort in your chest.  Crushing, pressure, aching, or squeezing pain.  Burning or tingling.  Dull or sharp pain that is worse when you move, cough, or take a deep breath.  Pain or discomfort that is also felt in your back, neck, jaw, shoulder, or arm, or pain that spreads to any of these areas. It is hard to know whether your pain is caused by something that is serious or something that is not so  serious. So it is important to see your doctor right away if you have chest pain. Follow these instructions at home: Medicines  Take over-the-counter and prescription medicines only as told by your doctor.  If you were prescribed an antibiotic medicine, take it as told by your doctor. Do not stop taking the antibiotic even if you start to feel better. Lifestyle   Rest as told by your doctor.  Do not use any products that contain nicotine or tobacco, such as cigarettes, e-cigarettes, and chewing tobacco. If you need help quitting,  ask your doctor.  Do not drink alcohol.  Make lifestyle changes as told by your doctor. These may include: ? Getting regular exercise. Ask your doctor what activities are safe for you. ? Eating a heart-healthy diet. A diet and nutrition specialist (dietitian) can help you to learn healthy eating options. ? Staying at a healthy weight. ? Treating diabetes or high blood pressure, if needed. ? Lowering your stress. Activities such as yoga and relaxation techniques can help. General instructions  Pay attention to any changes in your symptoms. Tell your doctor about them or any new symptoms.  Avoid any activities that cause chest pain.  Keep all follow-up visits as told by your doctor. This is important. You may need more testing if your chest pain does not go away. Contact a doctor if:  Your chest pain does not go away.  You feel depressed.  You have a fever. Get help right away if:  Your chest pain is worse.  You have a cough that gets worse, or you cough up blood.  You have very bad (severe) pain in your belly (abdomen).  You pass out (faint).  You have either of these for no clear reason: ? Sudden chest discomfort. ? Sudden discomfort in your arms, back, neck, or jaw.  You have shortness of breath at any time.  You suddenly start to sweat, or your skin gets clammy.  You feel sick to your stomach (nauseous).  You throw up (vomit).  You  suddenly feel lightheaded or dizzy.  You feel very weak or tired.  Your heart starts to beat fast, or it feels like it is skipping beats. These symptoms may be an emergency. Do not wait to see if the symptoms will go away. Get medical help right away. Call your local emergency services (911 in the [place] Do not drive yourself to the hospital. Summary  Chest pain can be caused by many different conditions. The cause may be serious and need treatment right away. If you have chest pain, see your doctor right away.  Follow your doctor's instructions for taking medicines and making lifestyle changes.  Keep all follow-up visits as told by your doctor. This includes visits for any further testing if your chest pain does not go away.  Be sure to know the signs that show that your condition has become worse. Get help right away if you have these symptoms. This information is not intended to replace advice given to you by your health care provider. Make sure you discuss any questions you have with your health care provider. Document Released: 06/04/2008 Document Revised: 06/19/2018 Document Reviewed: 06/19/2018 Elsevier Interactive Patient Education  2019 [employer] Inc[employer]  Cough, Adult  Coughing is a reflex that clears your throat and your airways. Coughing helps to heal and protect your lungs. It is normal to cough occasionally, but a cough that happens with other symptoms or lasts a long time may be a sign of a condition that needs treatment. A cough may last only 2-3 weeks (acute), or it may last longer than 8 weeks (chronic). What are the causes? Coughing is commonly caused by:  Breathing in substances that irritate your lungs.  A viral or bacterial respiratory infection.  Allergies.  Asthma.  Postnasal drip.  Smoking.  Acid backing up from the stomach into the esophagus (gastroesophageal reflux).  Certain medicines.  Chronic lung problems, including COPD (or rarely, lung  cancer).  Other medical conditions such as heart failure. Follow these instructions at home: Pay  attention to any changes in your symptoms. Take these actions to help with your discomfort:  Take medicines only as told by your health care provider. ? If you were prescribed an antibiotic medicine, take it as told by your health care provider. Do not stop taking the antibiotic even if you start to feel better. ? Talk with your health care provider before you take a cough suppressant medicine.  Drink enough fluid to keep your urine clear or pale yellow.  If the air is dry, use a cold steam vaporizer or humidifier in your bedroom or your home to help loosen secretions.  Avoid anything that causes you to cough at work or at home.  If your cough is worse at night, try sleeping in a semi-upright position.  Avoid cigarette smoke. If you smoke, quit smoking. If you need help quitting, ask your health care provider.  Avoid caffeine.  Avoid alcohol.  Rest as needed. Contact a health care provider if:  You have new symptoms.  You cough up pus.  Your cough does not get better after 2-3 weeks, or your cough gets worse.  You cannot control your cough with suppressant medicines and you are losing sleep.  You develop pain that is getting worse or pain that is not controlled with pain medicines.  You have a fever.  You have unexplained weight loss.  You have night sweats. Get help right away if:  You cough up blood.  You have difficulty breathing.  Your heartbeat is very fast. This information is not intended to replace advice given to you by your health care provider. Make sure you discuss any questions you have with your health care provider. Document Released: 06/15/2011 Document Revised: 05/24/2016 Document Reviewed: 02/23/2015 Elsevier Interactive Patient Education  [employer].   If you have lab work done today you will be contacted with your lab results within the  next 2 weeks.  If you have not heard from [place] then please contact us. The fastest way to get your results is to register for My Chart.   IF you received an x-ray today, you will receive an invoice from [facility]. Please contact [facility] at [phone] with questions or concerns regarding your invoice.   IF you received labwork today, you will receive an invoice from [facility]. Please contact [facility] at [phone] with questions or concerns regarding your invoice.   Our billing staff will not be able to assist you with questions regarding bills from these companies.  You will be contacted with the lab results as soon as they are available. The fastest way to get your results is to activate your My Chart account. Instructions are located on the last page of this paperwork. If you have not heard from [place] regarding the results in 2 weeks, please contact this office.

## 2019-01-21 NOTE — Progress Notes (Signed)
Subjective:     Patient ID: Leonard Henry, male    DOB: [birth year], 44 y.o.   MRN: [id number]  Chief Complaint  Patient presents with  . Annual Exam  . Elbow Pain    right elbow x 4-5 month  . Cough    x 2 wk   HPI Leonard Henry is a 44 y.o. male who is here at [place] at [facility] complaining of right elbow pain and cough. He is a new patient to me. Plans recheck for physical.   Right elbow pain Started in 04/2018. Was treated with a brace that he used once or twice. He found the brace to be too tight and uncomfortable.   Cough Started 2-3 weeks ago. He feels like there is something stuck in his throat. The cough is dry with no fever or dyspnea. He coughs throughout the day.  Chest Tightness feels mild chest pressure, mainly on the left side. The chest pressure has been there for 10 years. The chest tightness is always there. He is concerned about heart disease because of history of cardiovascular diseases in both parents. No history of BP or DM. He has seen cardiologist Dr. Curly Shores for his symptoms. Calcium score on coronary CT  was zero. He was started on Lipitor and Metoprolol.  Heartburn Happens 1-2 days a week. Not currently on medications.  HLD Lab Results  Component Value Date   CHOL 199 01/15/2018   HDL 41 01/15/2018   LDLCALC 131 (H) 01/15/2018   TRIG 134 01/15/2018   CHOLHDL 4.9 01/15/2018      Patient Active Problem List   Diagnosis Date Noted  . Plantar fasciitis, bilateral 04/22/2018   Past Medical History:  Diagnosis Date  . Chest pain    History reviewed. No pertinent surgical history. Allergies  Allergen Reactions  . Iopamidol Itching and Nausea And Vomiting   Prior to Admission medications   Medication Sig Start Date End Date Taking? Authorizing Provider  atorvastatin (LIPITOR) 20 MG tablet Take 1 tablet (20 mg total) by mouth daily. 02/11/18 05/12/18  Leanor Kail, PA  meloxicam (MOBIC) 15 MG tablet Take 1 tablet (15 mg total) by mouth  daily. 04/22/18   McVey, Gelene Mink, PA-C  metoprolol tartrate (LOPRESSOR) 50 MG tablet Take 1 tablet (50 mg total) by mouth once for 1 dose. 02/11/18 02/11/18  Leanor Kail, PA   Social History   Socioeconomic History  . Marital status: Married    Spouse name: Not on file  . Number of children: Not on file  . Years of education: Not on file  . Highest education level: Not on file  Occupational History  . Not on file  Social Needs  . Financial resource strain: Not on file  . Food insecurity:    Worry: Not on file    Inability: Not on file  . Transportation needs:    Medical: Not on file    Non-medical: Not on file  Tobacco Use  . Smoking status: Never Smoker  . Smokeless tobacco: Never Used  Substance and Sexual Activity  . Alcohol use: Yes  . Drug use: No  . Sexual activity: Not on file  Lifestyle  . Physical activity:    Days per week: Not on file    Minutes per session: Not on file  . Stress: Not on file  Relationships  . Social connections:    Talks on phone: Not on file    Gets together: Not on file    Attends  religious service: Not on file    Active member of club or organization: Not on file    Attends meetings of clubs or organizations: Not on file    Relationship status: Not on file  . Intimate partner violence:    Fear of current or ex partner: Not on file    Emotionally abused: Not on file    Physically abused: Not on file    Forced sexual activity: Not on file  Other Topics Concern  . Not on file  Social History Narrative  . Not on file    Review of Systems As HPI    Objective:    Physical Exam  Constitutional: He is oriented to person, place, and time. He appears well-developed and well-nourished.  HENT:  Head: Normocephalic and atraumatic.  Mouth/Throat: Mucous membranes are not dry.  Eyes: Pupils are equal, round, and reactive to light. EOM are normal.  Neck: No JVD present. Carotid bruit is not present.  Cardiovascular: Normal  rate, regular rhythm and normal heart sounds.  No murmur heard. Pulmonary/Chest: Effort normal and breath sounds normal. He has no rales.  Musculoskeletal:        General: No edema.     Right elbow: Tenderness found. Lateral epicondyle tenderness noted.  Neurological: He is alert and oriented to person, place, and time.  Skin: Skin is warm and dry.  Psychiatric: He has a normal mood and affect.  Vitals reviewed.   Vitals:   01/21/19 1507  BP: 102/73  Pulse: 99  Resp: 16  Temp: 98.5 F (36.9 C)  TempSrc: Oral  SpO2: 96%  Weight: 239 lb 12.8 oz (108.8 kg)  Height: 5\' 10"  (1.778 m)    Right Elbow: tenderness at right lateral epicondyle. Mild distal triceps tenderness. ROM intact. Pain with extension resistance of the right wrist.   EKG: sinus rhythm, no acute findings.   Dg Chest 2 View  Result Date: 01/21/2019 CLINICAL DATA:  Cough and left-sided chest pain for several weeks. EXAM: CHEST - 2 VIEW COMPARISON:  01/15/2018 FINDINGS: The heart size and mediastinal contours are within normal limits. Mild scarring again seen in lateral left lung base. No evidence of pulmonary infiltrate or edema. No evidence of pleural effusion. The visualized skeletal structures are unremarkable. IMPRESSION: Stable exam.  No active cardiopulmonary disease. Electronically Signed   By: Earle Gell M.D.   On: 01/21/2019 16:14       Assessment & Plan:    Leonard Henry is a 44 y.o. male Cough - Plan: DG Chest 2 View, benzonatate (TESSALON) 100 MG capsule Heartburn - Plan: omeprazole (PRILOSEC) 20 MG capsule  - possible reflux. Reassuring exam.   - trial of PPI, trigger avoidance of GERD.   - tessalon perles.   FH: CAD (coronary artery disease Chest pressure - Plan: DG Chest 2 View, Sedimentation Rate, omeprazole (PRILOSEC) 20 MG capsule, EKG 12-Lead, CBC, Ambulatory referral to Cardiology Hyperlipidemia, unspecified hyperlipidemia type - Plan: Comprehensive metabolic panel, Lipid panel Screening for  thyroid disorder - Plan: TSH  - no acute findings on EKG. Prior testing reassuring.   - treat for possible GERD as above.   - refer back to cardiology for eval/discussion of further testing given family hx.   - check lipids  - RTC/ER precautions.   Lateral epicondylitis of right elbow  - tennis elbow splint and activity modification discussed.   Meds ordered this encounter  Medications  . omeprazole (PRILOSEC) 20 MG capsule    Sig: Take 1 capsule (20  mg total) by mouth daily.    Dispense:  30 capsule    Refill:  1  . benzonatate (TESSALON) 100 MG capsule    Sig: Take 1 capsule (100 mg total) by mouth 3 (three) times daily as needed for cough.    Dispense:  20 capsule    Refill:  0  . Elastic Bandages & Supports (TENNIS ELBOW STRAP/GEL PAD) MISC    Sig: Apply on elbow everyday for support.    Dispense:  1 each    Refill:  0   Patient Instructions   For chest pressure I referred you back to the cardiologist.  Screening test last year looked okay.  I will check cholesterol test again as those were elevated last year.Return to the clinic or go to the nearest emergency room if any of your symptoms worsen or new symptoms occur.  Cough and heartburn symptoms could also be contributing to chest symptoms. See list of foods to avoid below.  Start omeprazole once per day and follow-up in the next 3 to 4 weeks, sooner if worsening.  Tessalon Perles if needed temporarily up to 3 times per day for cough.  I expect that to improve with treating heartburn symptoms.  Elbow pain still appears to be lateral epicondylitis or tennis elbow.   Apply the brace daily just beyond the area of soreness.  Avoid grasping objects with palm down.  See other information below.  Follow-up in 3 to 4 weeks, and if not improving at that time, I can refer you to a specialist.  Thank you for coming in today.  We can complete a physical at a later visit as acute issues above were addressed today.  Return to the clinic or  go to the nearest emergency room if any of your symptoms worsen or new symptoms occur.    Tennis Elbow  Tennis elbow (lateral epicondylitis) is inflammation of tendons in your outer forearm, near your elbow. Tendons are tissues that connect muscle to bone. When you have tennis elbow, inflammation affects the tendons that you use to bend your wrist and move your hand up. Inflammation occurs in the lower part of the upper arm bone (humerus), where the tendons connect to the bone (lateral epicondyle). Tennis elbow often affects people who play tennis, but anyone may get the condition from repeatedly extending the wrist or turning the forearm. What are the causes? This condition is usually caused by repeatedly extending the wrist, turning the forearm, and using the hands. It can result from sports or work that requires repetitive forearm movements. In some cases, it may be caused by a sudden injury. What increases the risk? You are more likely to develop tennis elbow if you play tennis or another racket sport. You also have a higher risk if you frequently use your hands for work. Besides people who play tennis, others at greater risk include:  Musicians.  Carpenters, painters, and plumbers.  Cooks.  Cashiers.  People who work in [employer].  Architect workers.  Butchers.  People who use computers. What are the signs or symptoms? Symptoms of this condition include:  Pain and tenderness in the forearm and the outer part of the elbow. Pain may be felt only when using the arm, or it may be there all the time.  A burning feeling that starts in the elbow and spreads down the forearm.  A weak grip in the hand. How is this diagnosed? This condition may be diagnosed based on:  Your symptoms and  medical history.  A physical exam.  X-rays.  MRI. How is this treated? Resting and icing your arm is often the first treatment. Your health care provider may also recommend:  Medicines to  reduce pain and inflammation. These may be in the form of a pill, topical gels, or shots of a steroid medicine (cortisone).  An elbow strap to reduce stress on the area.  Physical therapy. This may include massage or exercises.  An elbow brace to restrict the movements that cause symptoms. If these treatments do not help relieve your symptoms, your health care provider may recommend surgery to remove damaged muscle and reattach healthy muscle to bone. Follow these instructions at home: Activity  Rest your elbow and wrist and avoid activities that cause symptoms, as told by your health care provider.  Do physical therapy exercises as instructed.  If you lift an object, lift it with your palm facing up. This reduces stress on your elbow. Lifestyle  If your tennis elbow is caused by sports, check your equipment and make sure that: ? You are using it correctly. ? It is the best fit for you.  If your tennis elbow is caused by work or computer use, take frequent breaks to stretch your arm. Talk with your manager about ways to manage your condition at work. If you have a brace:  Wear the brace or strap as told by your health care provider. Remove it only as told by your health care provider.  Loosen the brace if your fingers tingle, become numb, or turn cold and blue.  Keep the brace clean.  If the brace is not waterproof, ask if you may remove it for bathing. If you must keep the brace on while bathing: ? Do not let it get wet. ? Cover it with a watertight covering when you take a bath or a shower. General instructions   If directed, put ice on the painful area: ? Put ice in a plastic bag. ? Place a towel between your skin and the bag. ? Leave the ice on for 20 minutes, 2-3 times a day.  Take over-the-counter and prescription medicines only as told by your health care provider.  Keep all follow-up visits as told by your health care provider. This is important. Contact a health  care provider if:  You have pain that gets worse or does not get better with treatment.  You have numbness or weakness in your forearm, hand, or fingers. Summary  Tennis elbow (lateral epicondylitis) is inflammation of tendons in your outer forearm, near your elbow.  Common symptoms include pain and tenderness in your forearm and the outer part of your elbow.  This condition is usually caused by repeatedly extending your wrist, turning your forearm, and using your hands.  The first treatment is often resting and icing your arm to relieve symptoms. Further treatment may include taking medicine, getting physical therapy, wearing a brace or strap, or having surgery. This information is not intended to replace advice given to you by your health care provider. Make sure you discuss any questions you have with your health care provider. Document Released: 12/17/2005 Document Revised: 10/01/2017 Document Reviewed: 10/01/2017 Elsevier Interactive Patient Education  2019 [place] for Gastroesophageal Reflux Disease, Adult When you have gastroesophageal reflux disease (GERD), the foods you eat and your eating habits are very important. Choosing the right foods can help ease your discomfort. Think about working with a nutrition specialist (dietitian) to help you make  good choices. What are tips for following this plan?  Meals  Choose healthy foods that are low in fat, such as fruits, vegetables, whole grains, low-fat dairy products, and lean meat, fish, and poultry.  Eat small meals often instead of 3 large meals a day. Eat your meals slowly, and in a place where you are relaxed. Avoid bending over or lying down until 2-3 hours after eating.  Avoid eating meals 2-3 hours before bed.  Avoid drinking a lot of liquid with meals.  Cook foods using methods other than frying. Bake, grill, or broil food instead.  Avoid or limit: ? Chocolate. ? Peppermint or  spearmint. ? Alcohol. ? Pepper. ? Black and decaffeinated coffee. ? Black and decaffeinated tea. ? Bubbly (carbonated) soft drinks. ? Caffeinated energy drinks and soft drinks.  Limit high-fat foods such as: ? Fatty meat or fried foods. ? Whole milk, cream, butter, or ice cream. ? Nuts and nut butters. ? Pastries, donuts, and sweets made with butter or shortening.  Avoid foods that cause symptoms. These foods may be different for everyone. Common foods that cause symptoms include: ? Tomatoes. ? Oranges, lemons, and limes. ? Peppers. ? Spicy food. ? Onions and garlic. ? Vinegar. Lifestyle  Maintain a healthy weight. Ask your doctor what weight is healthy for you. If you need to lose weight, work with your doctor to do so safely.  Exercise for at least 30 minutes for 5 or more days each week, or as told by your doctor.  Wear loose-fitting clothes.  Do not smoke. If you need help quitting, ask your doctor.  Sleep with the head of your bed higher than your feet. Use a wedge under the mattress or blocks under the bed frame to raise the head of the bed. Summary  When you have gastroesophageal reflux disease (GERD), food and lifestyle choices are very important in easing your symptoms.  Eat small meals often instead of 3 large meals a day. Eat your meals slowly, and in a place where you are relaxed.  Limit high-fat foods such as fatty meat or fried foods.  Avoid bending over or lying down until 2-3 hours after eating.  Avoid peppermint and spearmint, caffeine, alcohol, and chocolate. This information is not intended to replace advice given to you by your health care provider. Make sure you discuss any questions you have with your health care provider. Document Released: 06/17/2012 Document Revised: 01/22/2017 Document Reviewed: 01/22/2017 Elsevier Interactive Patient Education  2019 Else[employer] Inc.  Nonspecific Chest Pain Chest pain can be caused by many different conditions.  Some causes of chest pain can be life-threatening. These will require treatment right away. Serious causes of chest pain include:  Heart attack.  A tear in the body's main blood vessel.  Redness and swelling (inflammation) around your heart.  Blood clot in your lungs. Other causes of chest pain may not be so serious. These include:  Heartburn.  Anxiety or stress.  Damage to bones or muscles in your chest.  Lung infections. Chest pain can feel like:  Pain or discomfort in your chest.  Crushing, pressure, aching, or squeezing pain.  Burning or tingling.  Dull or sharp pain that is worse when you move, cough, or take a deep breath.  Pain or discomfort that is also felt in your back, neck, jaw, shoulder, or arm, or pain that spreads to any of these areas. It is hard to know whether your pain is caused by something that is serious or something  that is not so serious. So it is important to see your doctor right away if you have chest pain. Follow these instructions at home: Medicines  Take over-the-counter and prescription medicines only as told by your doctor.  If you were prescribed an antibiotic medicine, take it as told by your doctor. Do not stop taking the antibiotic even if you start to feel better. Lifestyle   Rest as told by your doctor.  Do not use any products that contain nicotine or tobacco, such as cigarettes, e-cigarettes, and chewing tobacco. If you need help quitting, ask your doctor.  Do not drink alcohol.  Make lifestyle changes as told by your doctor. These may include: ? Getting regular exercise. Ask your doctor what activities are safe for you. ? Eating a heart-healthy diet. A diet and nutrition specialist (dietitian) can help you to learn healthy eating options. ? Staying at a healthy weight. ? Treating diabetes or high blood pressure, if needed. ? Lowering your stress. Activities such as yoga and relaxation techniques can help. General  instructions  Pay attention to any changes in your symptoms. Tell your doctor about them or any new symptoms.  Avoid any activities that cause chest pain.  Keep all follow-up visits as told by your doctor. This is important. You may need more testing if your chest pain does not go away. Contact a doctor if:  Your chest pain does not go away.  You feel depressed.  You have a fever. Get help right away if:  Your chest pain is worse.  You have a cough that gets worse, or you cough up blood.  You have very bad (severe) pain in your belly (abdomen).  You pass out (faint).  You have either of these for no clear reason: ? Sudden chest discomfort. ? Sudden discomfort in your arms, back, neck, or jaw.  You have shortness of breath at any time.  You suddenly start to sweat, or your skin gets clammy.  You feel sick to your stomach (nauseous).  You throw up (vomit).  You suddenly feel lightheaded or dizzy.  You feel very weak or tired.  Your heart starts to beat fast, or it feels like it is skipping beats. These symptoms may be an emergency. Do not wait to see if the symptoms will go away. Get medical help right away. Call your local emergency services (911 in the [place] Do not drive yourself to the hospital. Summary  Chest pain can be caused by many different conditions. The cause may be serious and need treatment right away. If you have chest pain, see your doctor right away.  Follow your doctor's instructions for taking medicines and making lifestyle changes.  Keep all follow-up visits as told by your doctor. This includes visits for any further testing if your chest pain does not go away.  Be sure to know the signs that show that your condition has become worse. Get help right away if you have these symptoms. This information is not intended to replace advice given to you by your health care provider. Make sure you discuss any questions you have with your health care  provider. Document Released: 06/04/2008 Document Revised: 06/19/2018 Document Reviewed: 06/19/2018 Elsevier Interactive Patient Education  2019 Elsevier Inc.  Cough, Adult  Coughing is a reflex that clears your throat and your airways. Coughing helps to heal and protect your lungs. It is normal to cough occasionally, but a cough that happens with other symptoms or lasts a long time may  be a sign of a condition that needs treatment. A cough may last only 2-3 weeks (acute), or it may last longer than 8 weeks (chronic). What are the causes? Coughing is commonly caused by:  Breathing in substances that irritate your lungs.  A viral or bacterial respiratory infection.  Allergies.  Asthma.  Postnasal drip.  Smoking.  Acid backing up from the stomach into the esophagus (gastroesophageal reflux).  Certain medicines.  Chronic lung problems, including COPD (or rarely, lung cancer).  Other medical conditions such as heart failure. Follow these instructions at home: Pay attention to any changes in your symptoms. Take these actions to help with your discomfort:  Take medicines only as told by your health care provider. ? If you were prescribed an antibiotic medicine, take it as told by your health care provider. Do not stop taking the antibiotic even if you start to feel better. ? Talk with your health care provider before you take a cough suppressant medicine.  Drink enough fluid to keep your urine clear or pale yellow.  If the air is dry, use a cold steam vaporizer or humidifier in your bedroom or your home to help loosen secretions.  Avoid anything that causes you to cough at work or at home.  If your cough is worse at night, try sleeping in a semi-upright position.  Avoid cigarette smoke. If you smoke, quit smoking. If you need help quitting, ask your health care provider.  Avoid caffeine.  Avoid alcohol.  Rest as needed. Contact a health care provider if:  You have new  symptoms.  You cough up pus.  Your cough does not get better after 2-3 weeks, or your cough gets worse.  You cannot control your cough with suppressant medicines and you are losing sleep.  You develop pain that is getting worse or pain that is not controlled with pain medicines.  You have a fever.  You have unexplained weight loss.  You have night sweats. Get help right away if:  You cough up blood.  You have difficulty breathing.  Your heartbeat is very fast. This information is not intended to replace advice given to you by your health care provider. Make sure you discuss any questions you have with your health care provider. Document Released: 06/15/2011 Document Revised: 05/24/2016 Document Reviewed: 02/23/2015 Elsevier Interactive Patient Education  [employer].   If you have lab work done today you will be contacted with your lab results within the next 2 weeks.  If you have not heard from [place] then please contact us. The fastest way to get your results is to register for My Chart.   IF you received an x-ray today, you will receive an invoice from [facility]. Please contact [facility] at [phone] with questions or concerns regarding your invoice.   IF you received labwork today, you will receive an invoice from [facility]. Please contact [facility] at [phone] with questions or concerns regarding your invoice.   Our billing staff will not be able to assist you with questions regarding bills from these companies.  You will be contacted with the lab results as soon as they are available. The fastest way to get your results is to activate your My Chart account. Instructions are located on the last page of this paperwork. If you have not heard from [place] regarding the results in 2 weeks, please contact this office.           I, Noor Dweik am acting as Education administrator for  Dr. Merri Ray. I personally performed the services described in this  documentation, which was scribed in my presence. The recorded information has been reviewed and considered for accuracy and completeness, addended by me as needed, and agree with information above.  Signed,   Merri Ray, MD [facility].  01/23/19 10:12 PM

## 2019-01-22 LAB — LIPID PANEL
CHOLESTEROL TOTAL: 204 mg/dL — AB (ref 100–199)
Chol/HDL Ratio: 5.7 ratio — ABNORMAL HIGH (ref 0.0–5.0)
HDL: 36 mg/dL — AB (ref >39)
LDL Calculated: 113 mg/dL — ABNORMAL HIGH (ref 0–99)
TRIGLYCERIDES: 274 mg/dL — AB (ref 0–149)
VLDL Cholesterol Cal: 55 mg/dL — ABNORMAL HIGH (ref 5–40)

## 2019-01-22 LAB — SEDIMENTATION RATE: Sed Rate: 41 mm/hr — ABNORMAL HIGH (ref 0–15)

## 2019-01-22 LAB — COMPREHENSIVE METABOLIC PANEL
ALT: 27 IU/L (ref 0–44)
AST: 19 IU/L (ref 0–40)
Albumin/Globulin Ratio: 1.7 (ref 1.2–2.2)
Albumin: 4.5 g/dL (ref 4.0–5.0)
Alkaline Phosphatase: 84 IU/L (ref 39–117)
BILIRUBIN TOTAL: 0.9 mg/dL (ref 0.0–1.2)
BUN/Creatinine Ratio: 12 (ref 9–20)
BUN: 11 mg/dL (ref 6–24)
CHLORIDE: 99 mmol/L (ref 96–106)
CO2: 23 mmol/L (ref 20–29)
Calcium: 9.9 mg/dL (ref 8.7–10.2)
Creatinine, Ser: 0.93 mg/dL (ref 0.76–1.27)
GFR calc Af Amer: 116 mL/min/{1.73_m2} (ref >59)
GFR calc non Af Amer: 100 mL/min/{1.73_m2} (ref >59)
Globulin, Total: 2.6 g/dL (ref 1.5–4.5)
Glucose: 105 mg/dL — ABNORMAL HIGH (ref 65–99)
POTASSIUM: 4.3 mmol/L (ref 3.5–5.2)
Sodium: 138 mmol/L (ref 134–144)
Total Protein: 7.1 g/dL (ref 6.0–8.5)

## 2019-01-22 LAB — CBC
HEMOGLOBIN: 15.6 g/dL (ref 13.0–17.7)
Hematocrit: 44 % (ref 37.5–51.0)
MCH: 29.1 pg (ref 26.6–33.0)
MCHC: 35.5 g/dL (ref 31.5–35.7)
MCV: 82 fL (ref 79–97)
Platelets: 281 10*3/uL (ref 150–450)
RBC: 5.36 x10E6/uL (ref 4.14–5.80)
RDW: 13.8 % (ref 11.6–15.4)
WBC: 10.6 10*3/uL (ref 3.4–10.8)

## 2019-01-22 LAB — TSH: TSH: 4.29 u[IU]/mL (ref 0.450–4.500)

## 2019-01-23 ENCOUNTER — Telehealth: Payer: Self-pay | Admitting: Physician Assistant

## 2019-01-23 NOTE — Telephone Encounter (Signed)
° ° °  Scheduler called patient to set appointment from referral in Epic/Proficient. Patient states he has had chest pressure on and off for 1 year after any type of exertion. Patient states he does not have any pain today. Appt made with [facility] 2/12.  1. Are you having CP right now? NO  2. Are you experiencing any other symptoms (ex. SOB, nausea, vomiting, sweating)? TIRED  3. How long have you been experiencing CP? 1 YEAR  4. Is your CP continuous or coming and going? COMING AND GOING 5. Have you taken Nitroglycerin? NO ?

## 2019-01-23 NOTE — Telephone Encounter (Signed)
Spoke with pt and he states this is the same pain he had when he seen Vin last year.  Denies any issues at this moment.  Pressure comes on with exertion.  Pt seen by PCP and he referred him back to cardiology.  Advised when appropriate to head to ER.  Changed appt to 01/29/2019.

## 2019-01-23 NOTE — Telephone Encounter (Signed)
Will send to Triage for further eval.

## 2019-01-29 ENCOUNTER — Ambulatory Visit: Payer: Self-pay | Admitting: Physician Assistant

## 2019-01-29 DIAGNOSIS — R0989 Other specified symptoms and signs involving the circulatory and respiratory systems: Secondary | ICD-10-CM

## 2019-01-29 NOTE — Progress Notes (Deleted)
Cardiology Office Note    Date:  01/29/2019   ID:  Leonard Henry, DOB 10-17-1975, MRN [id number]  PCP:  Patient, No Pcp Per  Cardiologist: Dr. Acie Henry  Chief Complaint: Chest pain   History of Present Illness:   Leonard Henry is a 44 y.o. male with strong family hx of CAD presents for chest pain.   Strong family history of CAD.  Father died of MI at age 82.  Mother had a stenting in her early 15s.  Seem by me 01/2018 for chest pain concerning of angina for few years but worsen. Dr. Acie Henry recommended starting ASA and Lipitor. Follow up coronary CTA 02/2018 showed normal coronaries with calcium score of 0.   Here today for chest pain evaluation.   Past Medical History:  Diagnosis Date  . Chest pain     No past surgical history on file.  Current Medications: Prior to Admission medications   Medication Sig Start Date End Date Taking? Authorizing Provider  benzonatate (TESSALON) 100 MG capsule Take 1 capsule (100 mg total) by mouth 3 (three) times daily as needed for cough. 01/21/19   [name], MD  Elastic Bandages & Supports (TENNIS ELBOW STRAP/GEL PAD) MISC Apply on elbow everyday for support. 01/21/19   [name], MD  omeprazole (PRILOSEC) 20 MG capsule Take 1 capsule (20 mg total) by mouth daily. 01/21/19   [name], MD    Allergies:   Iopamidol   Social History   Socioeconomic History  . Marital status: Married    Spouse name: Not on file  . Number of children: Not on file  . Years of education: Not on file  . Highest education level: Not on file  Occupational History  . Not on file  Social Needs  . Financial resource strain: Not on file  . Food insecurity:    Worry: Not on file    Inability: Not on file  . Transportation needs:    Medical: Not on file    Non-medical: Not on file  Tobacco Use  . Smoking status: Never Smoker  . Smokeless tobacco: Never Used  Substance and Sexual Activity  . Alcohol use: Yes  . Drug use: No  . Sexual  activity: Not on file  Lifestyle  . Physical activity:    Days per week: Not on file    Minutes per session: Not on file  . Stress: Not on file  Relationships  . Social connections:    Talks on phone: Not on file    Gets together: Not on file    Attends religious service: Not on file    Active member of club or organization: Not on file    Attends meetings of clubs or organizations: Not on file    Relationship status: Not on file  Other Topics Concern  . Not on file  Social History Narrative  . Not on file     Family History:  The patient's family history includes Diabetes in his mother; Heart disease in his father and mother; Hyperlipidemia in his mother. ***  ROS:   Please see the history of present illness.    ROS All other systems reviewed and are negative.   PHYSICAL EXAM:   VS:  There were no vitals taken for this visit.   GEN: Well nourished, well developed, in no acute distress  HEENT: normal  Neck: no JVD, carotid bruits, or masses Cardiac: ***RRR; no murmurs, rubs, or gallops,no edema  Respiratory:  clear  to auscultation bilaterally, normal work of breathing GI: soft, nontender, nondistended, + BS MS: no deformity or atrophy  Skin: warm and dry, no rash Neuro:  Alert and Oriented x 3, Strength and sensation are intact Psych: euthymic mood, full affect  Wt Readings from Last 3 Encounters:  01/21/19 239 lb 12.8 oz (108.8 kg)  05/13/18 238 lb 9.6 oz (108.2 kg)  04/22/18 235 lb 9.6 oz (106.9 kg)      Studies/Labs Reviewed:   EKG:  EKG is ordered today.  The ekg ordered today demonstrates ***  Recent Labs: 01/21/2019: ALT 27; BUN 11; Creatinine, Ser 0.93; Hemoglobin 15.6; Platelets 281; Potassium 4.3; Sodium 138; TSH 4.290   Lipid Panel    Component Value Date/Time   CHOL 204 (H) 01/21/2019 1645   TRIG 274 (H) 01/21/2019 1645   HDL 36 (L) 01/21/2019 1645   CHOLHDL 5.7 (H) 01/21/2019 1645   LDLCALC 113 (H) 01/21/2019 1645    Additional studies/  records that were reviewed today include:   Coronary CTA 02/2018 IMPRESSION: 1.  Calcium Score 0  2.  Normal right dominant coronary arteries  3.  Normal aortic root 3.2 cm   ASSESSMENT & PLAN:    1. ***    Medication Adjustments/Labs and Tests Ordered: Current medicines are reviewed at length with the patient today.  Concerns regarding medicines are outlined above.  Medication changes, Labs and Tests ordered today are listed in the Patient Instructions below. There are no Patient Instructions on file for this visit.   [name], [name], [place]  01/29/2019 9:45 AM    [address], [place], [place]  [address] Phone: [phone]; Fax: [phone]

## 2019-01-30 ENCOUNTER — Encounter: Payer: Self-pay | Admitting: Physician Assistant

## 2019-02-03 ENCOUNTER — Ambulatory Visit (INDEPENDENT_AMBULATORY_CARE_PROVIDER_SITE_OTHER): Payer: BLUE CROSS/BLUE SHIELD | Admitting: Family Medicine

## 2019-02-03 ENCOUNTER — Encounter: Payer: Self-pay | Admitting: Family Medicine

## 2019-02-03 VITALS — BP 113/78 | HR 64 | Temp 98.4°F | Resp 16 | Ht 70.0 in | Wt 247.2 lb

## 2019-02-03 DIAGNOSIS — M67921 Unspecified disorder of synovium and tendon, right upper arm: Secondary | ICD-10-CM | POA: Diagnosis not present

## 2019-02-03 DIAGNOSIS — R05 Cough: Secondary | ICD-10-CM | POA: Diagnosis not present

## 2019-02-03 DIAGNOSIS — R700 Elevated erythrocyte sedimentation rate: Secondary | ICD-10-CM

## 2019-02-03 DIAGNOSIS — R739 Hyperglycemia, unspecified: Secondary | ICD-10-CM

## 2019-02-03 DIAGNOSIS — R0789 Other chest pain: Secondary | ICD-10-CM | POA: Diagnosis not present

## 2019-02-03 DIAGNOSIS — R059 Cough, unspecified: Secondary | ICD-10-CM

## 2019-02-03 NOTE — Patient Instructions (Addendum)
I still recommend use of the tennis elbow splint and try to avoid activities that make the elbow pain worse.  Given the 2 previous injections and persistent pain I will refer you to a hand specialist to decide on the next step in treatment.  Keep follow up with cardiologist as planned.  I will recheck the inflammation test today but see information on chest pain and stress below.  Cholesterol and blood sugar were mildly elevated last visit.  Avoid sugar containing beverages such as soda or sweet tea as much as possible, try to avoid fast food, watch portion sizes and follow-up if you are having trouble losing weight.  If the blood sugar test is at diabetes or prediabetes level I can give you other information and we can discuss those results further.  Thanks for coming in today.   Return to the clinic or go to the nearest emergency room if any of your symptoms worsen or new symptoms occur.     Nonspecific Chest Pain Chest pain can be caused by many different conditions. Some causes of chest pain can be life-threatening. These will require treatment right away. Serious causes of chest pain include:  Heart attack.  A tear in the body's main blood vessel.  Redness and swelling (inflammation) around your heart.  Blood clot in your lungs. Other causes of chest pain may not be so serious. These include:  Heartburn.  Anxiety or stress.  Damage to bones or muscles in your chest.  Lung infections. Chest pain can feel like:  Pain or discomfort in your chest.  Crushing, pressure, aching, or squeezing pain.  Burning or tingling.  Dull or sharp pain that is worse when you move, cough, or take a deep breath.  Pain or discomfort that is also felt in your back, neck, jaw, shoulder, or arm, or pain that spreads to any of these areas. It is hard to know whether your pain is caused by something that is serious or something that is not so serious. So it is important to see your doctor  right away if you have chest pain. Follow these instructions at home: Medicines  Take over-the-counter and prescription medicines only as told by your doctor.  If you were prescribed an antibiotic medicine, take it as told by your doctor. Do not stop taking the antibiotic even if you start to feel better. Lifestyle   Rest as told by your doctor.  Do not use any products that contain nicotine or tobacco, such as cigarettes, e-cigarettes, and chewing tobacco. If you need help quitting, ask your doctor.  Do not drink alcohol.  Make lifestyle changes as told by your doctor. These may include: ? Getting regular exercise. Ask your doctor what activities are safe for you. ? Eating a heart-healthy diet. A diet and nutrition specialist (dietitian) can help you to learn healthy eating options. ? Staying at a healthy weight. ? Treating diabetes or high blood pressure, if needed. ? Lowering your stress. Activities such as yoga and relaxation techniques can help. General instructions  Pay attention to any changes in your symptoms. Tell your doctor about them or any new symptoms.  Avoid any activities that cause chest pain.  Keep all follow-up visits as told by your doctor. This is important. You may need more testing if your chest pain does not go away. Contact a doctor if:  Your chest pain does not go away.  You feel depressed.  You have a fever. Get help right away if:  Your chest pain is worse.  You have a cough that gets worse, or you cough up blood.  You have very bad (severe) pain in your belly (abdomen).  You pass out (faint).  You have either of these for no clear reason: ? Sudden chest discomfort. ? Sudden discomfort in your arms, back, neck, or jaw.  You have shortness of breath at any time.  You suddenly start to sweat, or your skin gets clammy.  You feel sick to your stomach (nauseous).  You throw up (vomit).  You suddenly feel lightheaded or dizzy.  You  feel very weak or tired.  Your heart starts to beat fast, or it feels like it is skipping beats. These symptoms may be an emergency. Do not wait to see if the symptoms will go away. Get medical help right away. Call your local emergency services (911 in the [place] Do not drive yourself to the hospital. Summary  Chest pain can be caused by many different conditions. The cause may be serious and need treatment right away. If you have chest pain, see your doctor right away.  Follow your doctor's instructions for taking medicines and making lifestyle changes.  Keep all follow-up visits as told by your doctor. This includes visits for any further testing if your chest pain does not go away.  Be sure to know the signs that show that your condition has become worse. Get help right away if you have these symptoms. This information is not intended to replace advice given to you by your health care provider. Make sure you discuss any questions you have with your health care provider. Document Released: 06/04/2008 Document Revised: 06/19/2018 Document Reviewed: 06/19/2018 Elsevier Interactive Patient Education  2019 [place] is a normal reaction to life events. Stress is what you feel when life demands more than you are used to, or more than you think you can handle. Some stress can be useful, such as studying for a test or meeting a deadline at work. Stress that occurs too often or for too long can cause problems. It can affect your emotional health and interfere with relationships and normal daily activities. Too much stress can weaken your body's defense system (immune system) and increase your risk for physical illness. If you already have a medical problem, stress can make it worse. What are the causes? All sorts of life events can cause stress. An event that causes stress for one person may not be stressful for another person. Major life events, whether positive or negative,  commonly cause stress. Examples include:  Losing a job or starting a new job.  Losing a loved one.  Moving to a new town or home.  Getting married or divorced.  Having a baby.  Injury or illness. Less obvious life events can also cause stress, especially if they occur day after day or in combination with each other. Examples include:  Working long hours.  Driving in traffic.  Caring for children.  Being in debt.  Being in a difficult relationship. What are the signs or symptoms? Stress can cause emotional symptoms, including:  Anxiety. This is feeling worried, afraid, on edge, overwhelmed, or out of control.  Anger, including irritation or impatience.  Depression. This is feeling sad, down, helpless, or guilty.  Trouble focusing, remembering, or making decisions. Stress can cause physical symptoms, including:  Aches and pains. These may affect your head, neck, back, stomach, or other areas of your body.  Tight muscles or a  clenched jaw.  Low energy.  Trouble sleeping. Stress can cause unhealthy behaviors, including:  Eating to feel better (overeating) or skipping meals.  Working too much or putting off tasks.  Smoking, drinking alcohol, or using drugs to feel better. How is this diagnosed? Stress is diagnosed through an assessment by your health care provider. He or she may diagnose this condition based on:  Your symptoms and any stressful life events.  Your medical history.  Tests to rule out other causes of your symptoms. Depending on your condition, your health care provider may refer you to a specialist for further evaluation. How is this treated?  Stress management techniques are the recommended treatment for stress. Medicine is not typically recommended for the treatment of stress. Techniques to reduce your reaction to stressful life events include:  Stress identification. Monitor yourself for symptoms of stress and identify what causes stress for  you. These skills may help you to avoid or prepare for stressful events.  Time management. Set your priorities, keep a calendar of events, and learn to say "no." Taking these actions can help you avoid making too many commitments. Techniques for coping with stress include:  Rethinking the problem. Try to think realistically about stressful events rather than ignoring them or overreacting. Try to find the positives in a stressful situation rather than focusing on the negatives.  Exercise. Physical exercise can release both physical and emotional tension. The key is to find a form of exercise that you enjoy and do it regularly.  Relaxation techniques. These relax the body and mind. The key is to find one or more that you enjoy and use the technique(s) regularly. Examples include: ? Meditation, deep breathing, or progressive relaxation techniques. ? Yoga or tai chi. ? Biofeedback, mindfulness techniques, or journaling. ? Listening to music, being out in nature, or participating in other hobbies.  Practicing a healthy lifestyle. Eat a balanced diet, drink plenty of water, limit or avoid caffeine, and get plenty of sleep.  Having a strong support network. Spend time with family, friends, or other people you enjoy being around. Express your feelings and talk things over with someone you trust. Counseling or talk therapy with a mental health professional may be helpful if you are having trouble managing stress on your own. Follow these instructions at home: Lifestyle   Avoid drugs.  Do not use any products that contain nicotine or tobacco, such as cigarettes and e-cigarettes. If you need help quitting, ask your health care provider.  Limit alcohol intake to no more than 1 drink a day for nonpregnant women and 2 drinks a day for men. One drink equals 12 oz of beer, 5 oz of wine, or 1 oz of hard liquor.  Do not use alcohol or drugs to relax.  Eat a balanced diet that includes fresh fruits and  vegetables, whole grains, lean meats, fish, eggs, and beans, and low-fat dairy. Avoid processed foods and foods high in added fat, sugar, and salt.  Exercise at least 30 minutes on 5 or more days each week.  Get 7-8 hours of sleep each night. General instructions   Practice stress management techniques as discussed with your health care provider.  Drink enough fluid to keep your urine clear or pale yellow.  Take over-the-counter and prescription medicines only as told by your health care provider.  Keep all follow-up visits as told by your health care provider. This is important. Contact a health care provider if:  Your symptoms get worse.  You  have new symptoms.  You feel overwhelmed by your problems and can no longer manage them on your own. Get help right away if:  You have thoughts of hurting yourself or others. If you ever feel like you may hurt yourself or others, or have thoughts about taking your own life, get help right away. You can go to your nearest emergency department or call:  Your local emergency services (911 in the [place]  A suicide crisis helpline, such as the [place] [facility] at 508-699-9501. This is open 24 hours a day. Summary  Stress is a normal reaction to life events. It can cause problems if it happens too often or for too long.  Practicing stress management techniques is the best way to treat stress.  Counseling or talk therapy with a mental health professional may be helpful if you are having trouble managing stress on your own. This information is not intended to replace advice given to you by your health care provider. Make sure you discuss any questions you have with your health care provider. Document Released: 06/12/2001 Document Revised: 02/06/2017 Document Reviewed: 02/06/2017 Elsevier Interactive Patient Education  [employer].   If you have lab work done today you will be contacted with your lab results within  the next 2 weeks.  If you have not heard from [place] then please contact us. The fastest way to get your results is to register for My Chart.   IF you received an x-ray today, you will receive an invoice from [facility]. Please contact [facility] at [phone] with questions or concerns regarding your invoice.   IF you received labwork today, you will receive an invoice from [facility]. Please contact [facility] at [phone] with questions or concerns regarding your invoice.   Our billing staff will not be able to assist you with questions regarding bills from these companies.  You will be contacted with the lab results as soon as they are available. The fastest way to get your results is to activate your My Chart account. Instructions are located on the last page of this paperwork. If you have not heard from [place] regarding the results in 2 weeks, please contact this office.

## 2019-02-03 NOTE — Progress Notes (Signed)
Subjective:    Patient ID: Leonard Henry, male    DOB: Apr 24, 1975, 44 y.o.   MRN: [id number]  HPI Leonard Henry is a 44 y.o. male Presents today for: Chief Complaint  Patient presents with  . elevated lab results    was seen here on 01/21/19 labs was drawn and was told to return today to go over lab results. On lab letter it states that patient  chelsterol and imflammation marker level was elevated long with blood sugar a few points elevated.  [name] Elbow Pain    was told to f/u on tennis elbow on next visit, spoke about elbow pain on 01/21/19 last visit. Tennis elbow is no better.    Cough with chest pressure.  See last office visit January 22.  No acute findings on EKG, treated for possible GERD with trigger avoidance, Tessalon Perles for cough and referred to cardiology given family history of cardiac disease.  TSH, CBC looked okay.  Sed rate was slightly elevated at 41. CXR stable - scarring in left lung base.  Cough has improved.  Still some soreness in chest when stressed or tired.  Has appt with cardiology this month. No new symptoms.  No pain with exertion (physical work).  Some increased stress at times. Less work past few months.  Depression screen [facility] 2/9 02/03/2019 01/21/2019 05/13/2018 04/22/2018 01/15/2018  Decreased Interest 0 0 0 0 0  Down, Depressed, Hopeless 0 0 0 0 0  PHQ - 2 Score 0 0 0 0 0      Hyperglycemia: Glucose 105 at last visit, 108 a year ago.   Hyperlipidemia:  Lab Results  Component Value Date   CHOL 204 (H) 01/21/2019   HDL 36 (L) 01/21/2019   LDLCALC 113 (H) 01/21/2019   TRIG 274 (H) 01/21/2019   CHOLHDL 5.7 (H) 01/21/2019  The 10-year ASCVD risk score Mikey Bussing DC Jr., et al., 2013) is: 2.1%   Values used to calculate the score:     Age: 41 years     Sex: Male     Is Non-Hispanic African American: No     Diabetic: No     Tobacco smoker: No     Systolic Blood Pressure: 333 mmHg     Is BP treated: No     HDL Cholesterol: 36 mg/dL     Total Cholesterol:  204 mg/dL   Elbow pain:  Right lateral epicondylitis discussed last visit, activity modification and tennis elbow splint with correct use were discussed. Using tennis elbow splint all the time now since last visit.  Friend who is a doctor injected area twice over 2 months last year. Pain comes back.    Patient Active Problem List   Diagnosis Date Noted  . Plantar fasciitis, bilateral 04/22/2018   Past Medical History:  Diagnosis Date  . Chest pain    No past surgical history on file. Allergies  Allergen Reactions  . Iopamidol Itching and Nausea And Vomiting   Prior to Admission medications   Medication Sig Start Date End Date Taking? Authorizing Provider  benzonatate (TESSALON) 100 MG capsule Take 1 capsule (100 mg total) by mouth 3 (three) times daily as needed for cough. 01/21/19   [name], MD  Elastic Bandages & Supports (TENNIS ELBOW STRAP/GEL PAD) MISC Apply on elbow everyday for support. 01/21/19   [name], MD  omeprazole (PRILOSEC) 20 MG capsule Take 1 capsule (20 mg total) by mouth daily. 01/21/19   [name], MD  Social History   Socioeconomic History  . Marital status: Married    Spouse name: Not on file  . Number of children: Not on file  . Years of education: Not on file  . Highest education level: Not on file  Occupational History  . Not on file  Social Needs  . Financial resource strain: Not on file  . Food insecurity:    Worry: Not on file    Inability: Not on file  . Transportation needs:    Medical: Not on file    Non-medical: Not on file  Tobacco Use  . Smoking status: Never Smoker  . Smokeless tobacco: Never Used  Substance and Sexual Activity  . Alcohol use: Yes  . Drug use: No  . Sexual activity: Not on file  Lifestyle  . Physical activity:    Days per week: Not on file    Minutes per session: Not on file  . Stress: Not on file  Relationships  . Social connections:    Talks on phone: Not on file    Gets  together: Not on file    Attends religious service: Not on file    Active member of club or organization: Not on file    Attends meetings of clubs or organizations: Not on file    Relationship status: Not on file  . Intimate partner violence:    Fear of current or ex partner: Not on file    Emotionally abused: Not on file    Physically abused: Not on file    Forced sexual activity: Not on file  Other Topics Concern  . Not on file  Social History Narrative  . Not on file     Review of Systems Per HPI.     Objective:   Physical Exam Vitals signs reviewed.  Constitutional:      Appearance: He is well-developed.  HENT:     Head: Normocephalic and atraumatic.  Eyes:     Pupils: Pupils are equal, round, and reactive to light.  Neck:     Vascular: No carotid bruit or JVD.  Cardiovascular:     Rate and Rhythm: Normal rate and regular rhythm.     Heart sounds: Normal heart sounds. No murmur.  Pulmonary:     Effort: Pulmonary effort is normal.     Breath sounds: Normal breath sounds. No rales.  Skin:    General: Skin is warm and dry.  Neurological:     Mental Status: He is alert and oriented to person, place, and time.    Vitals:   02/03/19 1214  BP: 113/78  Pulse: 64  Resp: 16  Temp: 98.4 F (36.9 C)  TempSrc: Oral  SpO2: 96%  Weight: 247 lb 3.2 oz (112.1 kg)  Height: '5\' 10"'  (1.778 m)   Results for orders placed or performed in visit on 02/03/19  Hemoglobin A1c  Result Value Ref Range   Hgb A1c MFr Bld 5.8 (H) 4.8 - 5.6 %   Est. average glucose Bld gHb Est-mCnc 120 mg/dL  Sedimentation Rate  Result Value Ref Range   Sed Rate 14 0 - 15 mm/hr        Assessment & Plan:   Leonard Henry is a 44 y.o. male Cough Other chest pain - Plan: Sedimentation Rate Elevated sed rate - Plan: Sedimentation Rate  -Improved cough, repeat sed rate also improved.  Possibly has stress component.  Handout given on stress management.  Continue follow-up with cardiology as planned.   RTC/ER  precautions if worsening.  Tendinopathy of elbow, right - Plan: Ambulatory referral to Hand Surgery  -Longstanding symptoms, just started tennis elbow splinting but on further history has had multiple injections.  Will refer to hand surgeon to discuss treatment based on the additional information.  Repeat injection deferred at this time.  Continue counterforce bracing.   Hyperglycemia - Plan: Hemoglobin A1c  -Hyperglycemia along with slightly elevated cholesterol, prediabetes level.  Initial diet approach with cutting back on sugary-containing beverages, other information per handout below.  No orders of the defined types were placed in this encounter.  Patient Instructions    I still recommend use of the tennis elbow splint and try to avoid activities that make the elbow pain worse.  Given the 2 previous injections and persistent pain I will refer you to a hand specialist to decide on the next step in treatment.  Keep follow up with cardiologist as planned.  I will recheck the inflammation test today but see information on chest pain and stress below.  Cholesterol and blood sugar were mildly elevated last visit.  Avoid sugar containing beverages such as soda or sweet tea as much as possible, try to avoid fast food, watch portion sizes and follow-up if you are having trouble losing weight.  If the blood sugar test is at diabetes or prediabetes level I can give you other information and we can discuss those results further.  Thanks for coming in today.   Return to the clinic or go to the nearest emergency room if any of your symptoms worsen or new symptoms occur.     Nonspecific Chest Pain Chest pain can be caused by many different conditions. Some causes of chest pain can be life-threatening. These will require treatment right away. Serious causes of chest pain include:  Heart attack.  A tear in the body's main blood vessel.  Redness and swelling (inflammation) around your  heart.  Blood clot in your lungs. Other causes of chest pain may not be so serious. These include:  Heartburn.  Anxiety or stress.  Damage to bones or muscles in your chest.  Lung infections. Chest pain can feel like:  Pain or discomfort in your chest.  Crushing, pressure, aching, or squeezing pain.  Burning or tingling.  Dull or sharp pain that is worse when you move, cough, or take a deep breath.  Pain or discomfort that is also felt in your back, neck, jaw, shoulder, or arm, or pain that spreads to any of these areas. It is hard to know whether your pain is caused by something that is serious or something that is not so serious. So it is important to see your doctor right away if you have chest pain. Follow these instructions at home: Medicines  Take over-the-counter and prescription medicines only as told by your doctor.  If you were prescribed an antibiotic medicine, take it as told by your doctor. Do not stop taking the antibiotic even if you start to feel better. Lifestyle   Rest as told by your doctor.  Do not use any products that contain nicotine or tobacco, such as cigarettes, e-cigarettes, and chewing tobacco. If you need help quitting, ask your doctor.  Do not drink alcohol.  Make lifestyle changes as told by your doctor. These may include: ? Getting regular exercise. Ask your doctor what activities are safe for you. ? Eating a heart-healthy diet. A diet and nutrition specialist (dietitian) can help you to learn healthy eating options. ? Staying at a  healthy weight. ? Treating diabetes or high blood pressure, if needed. ? Lowering your stress. Activities such as yoga and relaxation techniques can help. General instructions  Pay attention to any changes in your symptoms. Tell your doctor about them or any new symptoms.  Avoid any activities that cause chest pain.  Keep all follow-up visits as told by your doctor. This is important. You may need more  testing if your chest pain does not go away. Contact a doctor if:  Your chest pain does not go away.  You feel depressed.  You have a fever. Get help right away if:  Your chest pain is worse.  You have a cough that gets worse, or you cough up blood.  You have very bad (severe) pain in your belly (abdomen).  You pass out (faint).  You have either of these for no clear reason: ? Sudden chest discomfort. ? Sudden discomfort in your arms, back, neck, or jaw.  You have shortness of breath at any time.  You suddenly start to sweat, or your skin gets clammy.  You feel sick to your stomach (nauseous).  You throw up (vomit).  You suddenly feel lightheaded or dizzy.  You feel very weak or tired.  Your heart starts to beat fast, or it feels like it is skipping beats. These symptoms may be an emergency. Do not wait to see if the symptoms will go away. Get medical help right away. Call your local emergency services (911 in the [place] Do not drive yourself to the hospital. Summary  Chest pain can be caused by many different conditions. The cause may be serious and need treatment right away. If you have chest pain, see your doctor right away.  Follow your doctor's instructions for taking medicines and making lifestyle changes.  Keep all follow-up visits as told by your doctor. This includes visits for any further testing if your chest pain does not go away.  Be sure to know the signs that show that your condition has become worse. Get help right away if you have these symptoms. This information is not intended to replace advice given to you by your health care provider. Make sure you discuss any questions you have with your health care provider. Document Released: 06/04/2008 Document Revised: 06/19/2018 Document Reviewed: 06/19/2018 Elsevier Interactive Patient Education  2019 [place] is a normal reaction to life events. Stress is what you feel when life  demands more than you are used to, or more than you think you can handle. Some stress can be useful, such as studying for a test or meeting a deadline at work. Stress that occurs too often or for too long can cause problems. It can affect your emotional health and interfere with relationships and normal daily activities. Too much stress can weaken your body's defense system (immune system) and increase your risk for physical illness. If you already have a medical problem, stress can make it worse. What are the causes? All sorts of life events can cause stress. An event that causes stress for one person may not be stressful for another person. Major life events, whether positive or negative, commonly cause stress. Examples include:  Losing a job or starting a new job.  Losing a loved one.  Moving to a new town or home.  Getting married or divorced.  Having a baby.  Injury or illness. Less obvious life events can also cause stress, especially if they occur day after day or in combination  with each other. Examples include:  Working long hours.  Driving in traffic.  Caring for children.  Being in debt.  Being in a difficult relationship. What are the signs or symptoms? Stress can cause emotional symptoms, including:  Anxiety. This is feeling worried, afraid, on edge, overwhelmed, or out of control.  Anger, including irritation or impatience.  Depression. This is feeling sad, down, helpless, or guilty.  Trouble focusing, remembering, or making decisions. Stress can cause physical symptoms, including:  Aches and pains. These may affect your head, neck, back, stomach, or other areas of your body.  Tight muscles or a clenched jaw.  Low energy.  Trouble sleeping. Stress can cause unhealthy behaviors, including:  Eating to feel better (overeating) or skipping meals.  Working too much or putting off tasks.  Smoking, drinking alcohol, or using drugs to feel better. How is this  diagnosed? Stress is diagnosed through an assessment by your health care provider. He or she may diagnose this condition based on:  Your symptoms and any stressful life events.  Your medical history.  Tests to rule out other causes of your symptoms. Depending on your condition, your health care provider may refer you to a specialist for further evaluation. How is this treated?  Stress management techniques are the recommended treatment for stress. Medicine is not typically recommended for the treatment of stress. Techniques to reduce your reaction to stressful life events include:  Stress identification. Monitor yourself for symptoms of stress and identify what causes stress for you. These skills may help you to avoid or prepare for stressful events.  Time management. Set your priorities, keep a calendar of events, and learn to say "no." Taking these actions can help you avoid making too many commitments. Techniques for coping with stress include:  Rethinking the problem. Try to think realistically about stressful events rather than ignoring them or overreacting. Try to find the positives in a stressful situation rather than focusing on the negatives.  Exercise. Physical exercise can release both physical and emotional tension. The key is to find a form of exercise that you enjoy and do it regularly.  Relaxation techniques. These relax the body and mind. The key is to find one or more that you enjoy and use the technique(s) regularly. Examples include: ? Meditation, deep breathing, or progressive relaxation techniques. ? Yoga or tai chi. ? Biofeedback, mindfulness techniques, or journaling. ? Listening to music, being out in nature, or participating in other hobbies.  Practicing a healthy lifestyle. Eat a balanced diet, drink plenty of water, limit or avoid caffeine, and get plenty of sleep.  Having a strong support network. Spend time with family, friends, or other people you enjoy  being around. Express your feelings and talk things over with someone you trust. Counseling or talk therapy with a mental health professional may be helpful if you are having trouble managing stress on your own. Follow these instructions at home: Lifestyle   Avoid drugs.  Do not use any products that contain nicotine or tobacco, such as cigarettes and e-cigarettes. If you need help quitting, ask your health care provider.  Limit alcohol intake to no more than 1 drink a day for nonpregnant women and 2 drinks a day for men. One drink equals 12 oz of beer, 5 oz of wine, or 1 oz of hard liquor.  Do not use alcohol or drugs to relax.  Eat a balanced diet that includes fresh fruits and vegetables, whole grains, lean meats, fish, eggs, and beans,  and low-fat dairy. Avoid processed foods and foods high in added fat, sugar, and salt.  Exercise at least 30 minutes on 5 or more days each week.  Get 7-8 hours of sleep each night. General instructions   Practice stress management techniques as discussed with your health care provider.  Drink enough fluid to keep your urine clear or pale yellow.  Take over-the-counter and prescription medicines only as told by your health care provider.  Keep all follow-up visits as told by your health care provider. This is important. Contact a health care provider if:  Your symptoms get worse.  You have new symptoms.  You feel overwhelmed by your problems and can no longer manage them on your own. Get help right away if:  You have thoughts of hurting yourself or others. If you ever feel like you may hurt yourself or others, or have thoughts about taking your own life, get help right away. You can go to your nearest emergency department or call:  Your local emergency services (911 in the [place]  A suicide crisis helpline, such as the [place] at 640-012-4978. This is open 24 hours a day. Summary  Stress is a normal  reaction to life events. It can cause problems if it happens too often or for too long.  Practicing stress management techniques is the best way to treat stress.  Counseling or talk therapy with a mental health professional may be helpful if you are having trouble managing stress on your own. This information is not intended to replace advice given to you by your health care provider. Make sure you discuss any questions you have with your health care provider. Document Released: 06/12/2001 Document Revised: 02/06/2017 Document Reviewed: 02/06/2017 Elsevier Interactive Patient Education  [employer].   If you have lab work done today you will be contacted with your lab results within the next 2 weeks.  If you have not heard from [place] then please contact us. The fastest way to get your results is to register for My Chart.   IF you received an x-ray today, you will receive an invoice from [facility]. Please contact [facility] at [phone] with questions or concerns regarding your invoice.   IF you received labwork today, you will receive an invoice from [facility]. Please contact [facility] at [phone] with questions or concerns regarding your invoice.   Our billing staff will not be able to assist you with questions regarding bills from these companies.  You will be contacted with the lab results as soon as they are available. The fastest way to get your results is to activate your My Chart account. Instructions are located on the last page of this paperwork. If you have not heard from [place] regarding the results in 2 weeks, please contact this office.       Signed,   Merri Ray, MD [facility] [place].  02/07/19 6:59 PM

## 2019-02-04 LAB — HEMOGLOBIN A1C
Est. average glucose Bld gHb Est-mCnc: 120 mg/dL
HEMOGLOBIN A1C: 5.8 % — AB (ref 4.8–5.6)

## 2019-02-04 LAB — SEDIMENTATION RATE: Sed Rate: 14 mm/hr (ref 0–15)

## 2019-02-05 ENCOUNTER — Telehealth: Payer: Self-pay

## 2019-02-05 ENCOUNTER — Encounter (INDEPENDENT_AMBULATORY_CARE_PROVIDER_SITE_OTHER): Payer: Self-pay | Admitting: Orthopaedic Surgery

## 2019-02-05 ENCOUNTER — Ambulatory Visit (INDEPENDENT_AMBULATORY_CARE_PROVIDER_SITE_OTHER): Payer: Self-pay

## 2019-02-05 ENCOUNTER — Ambulatory Visit (INDEPENDENT_AMBULATORY_CARE_PROVIDER_SITE_OTHER): Payer: BLUE CROSS/BLUE SHIELD | Admitting: Orthopaedic Surgery

## 2019-02-05 VITALS — Ht 70.0 in | Wt 247.2 lb

## 2019-02-05 DIAGNOSIS — M7712 Lateral epicondylitis, left elbow: Secondary | ICD-10-CM | POA: Insufficient documentation

## 2019-02-05 DIAGNOSIS — M7711 Lateral epicondylitis, right elbow: Secondary | ICD-10-CM | POA: Diagnosis not present

## 2019-02-05 NOTE — Progress Notes (Signed)
Office Visit Note   Patient: Leonard Henry           Date of Birth: 25-May-1975           MRN: [id number] Visit Date: 02/05/2019              Requested by: Leonard Agreste, MD [address] [place], [place] [address] PCP: Patient, No Pcp Per   Assessment & Plan: Visit Diagnoses:  1. Lateral epicondylitis of right elbow     Plan: Impression is right elbow lateral epicondylitis.  The patient is failed conservative treatment and believe it is appropriate to proceed with an MRI to further assess for structural abnormalities.  He will follow-up with [place] once that has been completed.  Follow-Up Instructions: Return in about 10 days (around 02/15/2019).   Orders:  Orders Placed This Encounter  Procedures  . XR Elbow Complete Right (3+View)  . MR Elbow Right w/o contrast   No orders of the defined types were placed in this encounter.     Procedures: No procedures performed   Clinical Data: No additional findings.   Subjective: Chief Complaint  Patient presents with  . 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  This began approximately 5 or 6 months ago without any known injury or change in activity.  He does note he does a lot of shoveling and painting on the job.  The pain he has is all to the lateral aspect.  He denies any weakness.  He has increased pain with gripping and certain motions while at work.  He has not taking any over-the-counter medications for this.  He denies any numbness, tingling or burning.  He does have a friend who is a physician who has injected the right tennis elbow twice with cortisone according to the patient.  He does note moderately for symptoms but these never lasted greater than a month.  His last injection was about 6 to 7 weeks ago.  He has tried the exercises as well as a strap without relief of symptoms.  Review of  Systems as detailed in HPI.  All others reviewed and are negative.   Objective: Vital Signs: Ht 5\' 10"  (1.778 m)   Wt 247 lb 3.2 oz (112.1 kg)   BMI 35.47 kg/m   Physical Exam well-developed well-nourished gentleman in no acute distress.  Alert and oriented x3.  Ortho Exam examination of his right elbow reveals marked tenderness over the lateral epicondyle.  Mild tenderness over the radial tunnel.  Increased pain with wrist extension, supination and gripping.  Full sensation distally.  Specialty Comments:  No specialty comments available.  Imaging: Xr Elbow Complete Right (3+view)  Result Date: 02/05/2019 No acute or structural abnormalities    PMFS History: Patient Active Problem List   Diagnosis Date Noted  . Lateral epicondylitis of right elbow 02/05/2019  . Plantar fasciitis, bilateral 04/22/2018   Past Medical History:  Diagnosis Date  . Chest pain     Family History  Problem Relation Age of Onset  . Diabetes Mother   . Heart disease Mother   . Hyperlipidemia Mother   . Heart disease Father     History reviewed. No pertinent surgical history. Social History   Occupational History  . Not on file  Tobacco Use  . Smoking status: Never Smoker  . Smokeless tobacco: Never Used  Substance and Sexual Activity  . Alcohol use: Yes  . Drug use: No  . Sexual activity: Not on file

## 2019-02-05 NOTE — Telephone Encounter (Signed)
-----   Message from [name], MD sent at 02/04/2019  9:21 AM EST ----- Call patient.  Inflammation test is now back to normal.  Hemoglobin A1c is in the prediabetic range.  Watch diet with avoidance of sugar containing beverages, avoidance of fast food, walking or some form of activity every day to help with weight loss and recheck levels in the next 3 to 6 months.  I can provide further diet informational handouts if needed.  Let me know if there are questions.

## 2019-02-05 NOTE — Telephone Encounter (Signed)
I have called pt and relayed message below. He stated understanding

## 2019-02-07 ENCOUNTER — Encounter: Payer: Self-pay | Admitting: Family Medicine

## 2019-02-11 ENCOUNTER — Ambulatory Visit: Payer: Self-pay | Admitting: Physician Assistant

## 2019-02-12 ENCOUNTER — Other Ambulatory Visit: Payer: Self-pay

## 2019-02-17 ENCOUNTER — Ambulatory Visit (INDEPENDENT_AMBULATORY_CARE_PROVIDER_SITE_OTHER): Payer: BLUE CROSS/BLUE SHIELD | Admitting: Orthopaedic Surgery

## 2019-02-19 ENCOUNTER — Ambulatory Visit: Payer: BLUE CROSS/BLUE SHIELD | Admitting: Family Medicine

## 2019-03-30 ENCOUNTER — Other Ambulatory Visit: Payer: Self-pay

## 2019-03-30 ENCOUNTER — Telehealth (INDEPENDENT_AMBULATORY_CARE_PROVIDER_SITE_OTHER): Payer: BLUE CROSS/BLUE SHIELD | Admitting: Family Medicine

## 2019-03-30 DIAGNOSIS — J029 Acute pharyngitis, unspecified: Secondary | ICD-10-CM

## 2019-03-30 MED ORDER — AMOXICILLIN 500 MG PO CAPS: 500.0000 mg | ORAL_CAPSULE | Freq: Two times a day (BID) | ORAL | 0 refills | Status: DC

## 2019-03-30 NOTE — Patient Instructions (Addendum)
Sore throat is likely due to a virus. Try over the counter lozenge such as cepacol, drink plenty of fluids. Try claritin or allegra once per day. If not improving in next few days, can start antibiotic amoxicillin for possible bacterial cause, but as we discussed that is less likely.   Sore Throat A sore throat is pain, burning, irritation, or scratchiness in the throat. When you have a sore throat, you may feel pain or tenderness in your throat when you swallow or talk. Many things can cause a sore throat, including:  An infection.  Seasonal allergies.  Dryness in the air.  Irritants, such as smoke or pollution.  Radiation treatment to the area.  Gastroesophageal reflux disease (GERD).  A tumor. A sore throat is often the first sign of another sickness. It may happen with other symptoms, such as coughing, sneezing, fever, and swollen neck glands. Most sore throats go away without medical treatment. Follow these instructions at home:      Take over-the-counter medicines only as told by your health care provider. ? If your child has a sore throat, do not give your child aspirin because of the association with Reye syndrome.  Drink enough fluids to keep your urine pale yellow.  Rest as needed.  To help with pain, try: ? Sipping warm liquids, such as broth, herbal tea, or warm water. ? Eating or drinking cold or frozen liquids, such as frozen ice pops. ? Gargling with a salt-water mixture 3-4 times a day or as needed. To make a salt-water mixture, completely dissolve -1 tsp (3-6 g) of salt in 1 cup (237 mL) of warm water. ? Sucking on hard candy or throat lozenges. ? Putting a cool-mist humidifier in your bedroom at night to moisten the air. ? Sitting in the bathroom with the door closed for 5-10 minutes while you run hot water in the shower.  Do not use any products that contain nicotine or tobacco, such as cigarettes, e-cigarettes, and chewing tobacco. If you need help  quitting, ask your health care provider.  Wash your hands well and often with soap and water. If soap and water are not available, use hand sanitizer. Contact a health care provider if:  You have a fever for more than 2-3 days.  You have symptoms that last (are persistent) for more than 2-3 days.  Your throat does not get better within 7 days.  You have a fever and your symptoms suddenly get worse.  Your child who is 3 months to 36 years old has a temperature of 102.54F (39C) or higher. Get help right away if:  You have difficulty breathing.  You cannot swallow fluids, soft foods, or your saliva.  You have increased swelling in your throat or neck.  You have persistent nausea and vomiting. Summary  A sore throat is pain, burning, irritation, or scratchiness in the throat. Many things can cause a sore throat.  Take over-the-counter medicines only as told by your health care provider. Do not give your child aspirin.  Drink plenty of fluids, and rest as needed.  Contact a health care provider if your symptoms worsen or your sore throat does not get better within 7 days. This information is not intended to replace advice given to you by your health care provider. Make sure you discuss any questions you have with your health care provider. Document Released: 01/24/2005 Document Revised: 05/19/2018 Document Reviewed: 05/19/2018 Elsevier Interactive Patient Education  [employer].   If you have lab  work done today you will be contacted with your lab results within the next 2 weeks.  If you have not heard from [place] then please contact us. The fastest way to get your results is to register for My Chart.   IF you received an x-ray today, you will receive an invoice from [facility]. Please contact [facility] at [phone] with questions or concerns regarding your invoice.   IF you received labwork today, you will receive an invoice from [facility]. Please  contact [facility] at [phone] with questions or concerns regarding your invoice.   Our billing staff will not be able to assist you with questions regarding bills from these companies.  You will be contacted with the lab results as soon as they are available. The fastest way to get your results is to activate your My Chart account. Instructions are located on the last page of this paperwork. If you have not heard from [place] regarding the results in 2 weeks, please contact this office.

## 2019-03-30 NOTE — Progress Notes (Signed)
CC- patient stasted he has a sorethroat for the past 7-8 days that will not go away. Have tried  otc Nyquil for the past 4-5 days. No cough,no fever and no chest congestion or any other symptoms at this time.

## 2019-03-30 NOTE — Progress Notes (Signed)
Virtual Visit via Telephone Note  I connected with [name] on 03/30/19 at 2:40 PM by telephone and verified that I am speaking with the correct person using two identifiers.   I discussed the limitations, risks, security and privacy concerns of performing an evaluation and management service by telephone and the availability of in person appointments. I also discussed with the patient that there may be a patient responsible charge related to this service. The patient expressed understanding and agreed to proceed, consent obtained  Chief complaint: sore throat  History of Present Illness: Leonard Henry is a 44 y.o. male  Sore throat: Started 6-7 days ago. No rhinorrhea, No nasal congestion or chest congestion. Very minimal cough, occasional clearing throat/cough - notes after eating only.  No dyspnea.  No known hx of seasonal allergies.  No known LAD.  Has been home for the most part past few weeks, no knwn sick contacts.  Able to clear secretions, liquids, eating normally.  No measured fever - 98.1, 97.7. no night sweats.  No heartburn.  Less symptoms in the morning, worse as day goes on.  Tx: nyquil at bedtime. Garlic in the morning, hot water.   No known sick contacts. No recent travel in past 2 weeks,  no known exposure to coronavirus infected person.   Patient Active Problem List   Diagnosis Date Noted  . Lateral epicondylitis of right elbow 02/05/2019  . Plantar fasciitis, bilateral 04/22/2018   Past Medical History:  Diagnosis Date  . Chest pain    No past surgical history on file. Allergies  Allergen Reactions  . Iopamidol Itching and Nausea And Vomiting   Prior to Admission medications   Not on File   Social History   Socioeconomic History  . Marital status: Married    Spouse name: Not on file  . Number of children: Not on file  . Years of education: Not on file  . Highest education level: Not on file  Occupational History  . Not on file  Social  Needs  . Financial resource strain: Not on file  . Food insecurity:    Worry: Not on file    Inability: Not on file  . Transportation needs:    Medical: Not on file    Non-medical: Not on file  Tobacco Use  . Smoking status: Never Smoker  . Smokeless tobacco: Never Used  Substance and Sexual Activity  . Alcohol use: Yes  . Drug use: No  . Sexual activity: Not on file  Lifestyle  . Physical activity:    Days per week: Not on file    Minutes per session: Not on file  . Stress: Not on file  Relationships  . Social connections:    Talks on phone: Not on file    Gets together: Not on file    Attends religious service: Not on file    Active member of club or organization: Not on file    Attends meetings of clubs or organizations: Not on file    Relationship status: Not on file  . Intimate partner violence:    Fear of current or ex partner: Not on file    Emotionally abused: Not on file    Physically abused: Not on file    Forced sexual activity: Not on file  Other Topics Concern  . Not on file  Social History Narrative  . Not on file     Observations/Objective:   Assessment and Plan: Sore throat - Plan: amoxicillin (AMOXIL)  500 MG capsule  -Isolated sore throat for past 6 to 8 days without significant improvement.  Denies fever.  Denies significant cough or dyspnea.  Less likely COVID-19.  Initially with suspected viral pharyngitis, versus less likely allergic without prior significant allergies.  Denies heartburn.  -Symptomatic care discussed with sore throat lozenge, fluids, trial of over-the-counter antihistamine for possible allergic cause.  If not improving in the next few days, can start amoxicillin for possible bacterial cause/strep.  Potential side effects and risk discussed.  RTC precautions if any worsening.  Still recommended isolation as much as possible while he is symptomatic.  Follow Up Instructions:    I discussed the assessment and treatment plan with the  patient. The patient was provided an opportunity to ask questions and all were answered. The patient agreed with the plan and demonstrated an understanding of the instructions.   The patient was advised to call back or seek an in-person evaluation if the symptoms worsen or if the condition fails to improve as anticipated.  I provided 18 minutes of non-face-to-face time during this encounter.  Signed,   Merri Ray, MD [facility].  03/30/19

## 2019-05-05 ENCOUNTER — Ambulatory Visit: Payer: BLUE CROSS/BLUE SHIELD | Admitting: Family Medicine

## 2019-05-05 ENCOUNTER — Encounter: Payer: Self-pay | Admitting: Family Medicine

## 2019-05-05 ENCOUNTER — Other Ambulatory Visit: Payer: Self-pay

## 2019-05-05 VITALS — BP 109/72 | HR 55 | Temp 97.2°F | Resp 16 | Wt 240.0 lb

## 2019-05-05 DIAGNOSIS — R1012 Left upper quadrant pain: Secondary | ICD-10-CM | POA: Diagnosis not present

## 2019-05-05 DIAGNOSIS — Z87442 Personal history of urinary calculi: Secondary | ICD-10-CM | POA: Diagnosis not present

## 2019-05-05 DIAGNOSIS — N23 Unspecified renal colic: Secondary | ICD-10-CM | POA: Diagnosis not present

## 2019-05-05 DIAGNOSIS — R109 Unspecified abdominal pain: Secondary | ICD-10-CM

## 2019-05-05 LAB — POCT URINALYSIS DIP (MANUAL ENTRY)
Bilirubin, UA: NEGATIVE
Blood, UA: NEGATIVE
Glucose, UA: NEGATIVE mg/dL
Ketones, POC UA: NEGATIVE mg/dL
Leukocytes, UA: NEGATIVE
Nitrite, UA: NEGATIVE
Protein Ur, POC: NEGATIVE mg/dL
Spec Grav, UA: >=1.03 — AB (ref 1.010–1.025)
Urobilinogen, UA: 0.2 E.U./dL
pH, UA: 5.5 (ref 5.0–8.0)

## 2019-05-05 MED ORDER — TAMSULOSIN HCL 0.4 MG PO CAPS
0.4000 mg | ORAL_CAPSULE | Freq: Every day | ORAL | 0 refills | Status: DC
Start: 2019-05-05 — End: 2019-07-08

## 2019-05-05 MED ORDER — HYDROCODONE-ACETAMINOPHEN 5-325 MG PO TABS: 1.0000 | ORAL_TABLET | Freq: Four times a day (QID) | ORAL | 0 refills | Status: DC | PRN

## 2019-05-05 NOTE — Progress Notes (Signed)
Subjective:    Patient ID: Leonard Henry, male    DOB: 06-23-1975, 44 y.o.   MRN: [id number]  HPI Leonard Henry is a 44 y.o. male Presents today for: Chief Complaint  Patient presents with  . Flank Pain    Pain in the left flank area with a hx of kidney stone. Would like to get another US of the kidney to see if I need another kidney surgery or medication to brake up the stone   Presents with left flank pain, left low back - started last night.  Started at rest. Radiates around to R side. Felt like needed to urinate last night without production of urine, but ok today.  No hematuria. No visible passed stone. Pain similar to prior stones.  No rash.   No dysuria.  Vomiting last night - once when in pain. No diarrhea. No pain today.  Took ibuprofen 800mg , tylenol 1000mg  last night. Pain resolved.   Had kidney stones twice in past most recently 2 years. Had to have surgery for stones twice, and treated with pills and passed stone on own most recently 2 years ago. Lived in [place] at that time. No recent ultrasound, or CT.  Urologist in [place] - no recent visit with urology.    Patient Active Problem List   Diagnosis Date Noted  . Lateral epicondylitis of right elbow 02/05/2019  . Plantar fasciitis, bilateral 04/22/2018   Past Medical History:  Diagnosis Date  . Chest pain    No past surgical history on file. Allergies  Allergen Reactions  . Iopamidol Itching and Nausea And Vomiting   Prior to Admission medications   Medication Sig Start Date End Date Taking? Authorizing Provider  acetaminophen (TYLENOL) 500 MG tablet Take 500 mg by mouth every 6 (six) hours as needed.   Yes [provider]  ibuprofen (ADVIL) 800 MG tablet Take 800 mg by mouth every 8 (eight) hours as needed.   Yes [provider]   Social History   Socioeconomic History  . Marital status: Married    Spouse name: Not on file  . Number of children: Not on file  . Years of education: Not on file  .  Highest education level: Not on file  Occupational History  . Not on file  Social Needs  . Financial resource strain: Not on file  . Food insecurity:    Worry: Not on file    Inability: Not on file  . Transportation needs:    Medical: Not on file    Non-medical: Not on file  Tobacco Use  . Smoking status: Never Smoker  . Smokeless tobacco: Never Used  Substance and Sexual Activity  . Alcohol use: Yes  . Drug use: No  . Sexual activity: Not on file  Lifestyle  . Physical activity:    Days per week: Not on file    Minutes per session: Not on file  . Stress: Not on file  Relationships  . Social connections:    Talks on phone: Not on file    Gets together: Not on file    Attends religious service: Not on file    Active member of club or organization: Not on file    Attends meetings of clubs or organizations: Not on file    Relationship status: Not on file  . Intimate partner violence:    Fear of current or ex partner: Not on file    Emotionally abused: Not on file    Physically abused:  Not on file    Forced sexual activity: Not on file  Other Topics Concern  . Not on file  Social History Narrative  . Not on file    Review of Systems     Objective:   Physical Exam Vitals signs reviewed.  Constitutional:      Appearance: He is well-developed.  HENT:     Head: Normocephalic and atraumatic.  Eyes:     Pupils: Pupils are equal, round, and reactive to light.  Neck:     Vascular: No carotid bruit or JVD.  Cardiovascular:     Rate and Rhythm: Normal rate and regular rhythm.     Heart sounds: Normal heart sounds. No murmur.  Pulmonary:     Effort: Pulmonary effort is normal.     Breath sounds: Normal breath sounds. No rales.  Abdominal:     General: Abdomen is flat. Bowel sounds are normal. There is no distension.     Palpations: Abdomen is soft.     Tenderness: There is no abdominal tenderness. There is no right CVA tenderness, left CVA tenderness or guarding.   Musculoskeletal:        General: No tenderness.     Lumbar back: He exhibits normal range of motion, no tenderness, no bony tenderness and no spasm.  Skin:    General: Skin is warm and dry.  Neurological:     Mental Status: He is alert and oriented to person, place, and time.     Abdomen nontender, no CVA tenderness.  No rash Vitals:   05/05/19 1106  BP: 109/72  Pulse: (!) 55  Resp: 16  Temp: (!) 97.2 F (36.2 C)  TempSrc: Oral  SpO2: 97%  Weight: 240 lb (108.9 kg)    Results for orders placed or performed in visit on 05/05/19  POCT urinalysis dipstick  Result Value Ref Range   Color, UA yellow yellow   Clarity, UA clear clear   Glucose, UA negative negative mg/dL   Bilirubin, UA negative negative   Ketones, POC UA negative negative mg/dL   Spec Grav, UA >=1.030 (A) 1.010 - 1.025   Blood, UA negative negative   pH, UA 5.5 5.0 - 8.0   Protein Ur, POC negative negative mg/dL   Urobilinogen, UA 0.2 0.2 or 1.0 E.U./dL   Nitrite, UA Negative Negative   Leukocytes, UA Negative Negative       Assessment & Plan:    Leonard Henry is a 44 y.o. male Kidney pain - Plan: POCT urinalysis dipstick, CT RENAL STONE STUDY, tamsulosin (FLOMAX) 0.4 MG CAPS capsule, HYDROcodone-acetaminophen (NORCO/VICODIN) 5-325 MG tablet  Left upper quadrant pain  History of nephrolithiasis - Plan: CT RENAL STONE STUDY, tamsulosin (FLOMAX) 0.4 MG CAPS capsule, HYDROcodone-acetaminophen (NORCO/VICODIN) 5-325 MG tablet  Flank pain - Plan: CT RENAL STONE STUDY, tamsulosin (FLOMAX) 0.4 MG CAPS capsule, HYDROcodone-acetaminophen (NORCO/VICODIN) 5-325 MG tablet  Left flank pain last night, similar to previous flares of nephrolithiasis.  Urinalysis reassuring at present, symptoms resolved overnight.  Possible small stone that was passed.  Option of meeting with urology, or imaging to evaluate for stone burden.  He would like to have imaging done at this time.  Potential risks of CT and radiation discussed.   -Continue fluids, symptomatic care and if recurrence of flank pain prescription for Flomax 0.4 mg daily, hydrocodone if needed for pain with potential effect discussed, filter for urine to trap stone, RTC precautions  -Check CT renal stone study, then consider urology eval depending on results.  Meds ordered this encounter  Medications  . tamsulosin (FLOMAX) 0.4 MG CAPS capsule    Sig: Take 1 capsule (0.4 mg total) by mouth daily.    Dispense:  14 capsule    Refill:  0  . HYDROcodone-acetaminophen (NORCO/VICODIN) 5-325 MG tablet    Sig: Take 1 tablet by mouth every 6 (six) hours as needed for moderate pain.    Dispense:  15 tablet    Refill:  0   Patient Instructions   Based on your symptoms last night, I am suspicious for possible kidney stone that has likely passed as symptoms much improved today, and urinalysis without blood. Make sure to drink plenty of fluid.   If pain returns, can start flomax once per day and hydrocodone if needed.  Use the filter and if stone passed, then bring to office or urology. We can determine if urology appointment needed based on imaging result.   CT renal stone study ordered for [place] - walk-in for study tomorrow morning or Thursday morning.   [address], [place] [address]   Return to the clinic or go to the nearest emergency room if any of your symptoms worsen or new symptoms occur.   Kidney Stones  Kidney stones (urolithiasis) are solid, rock-like deposits that form inside of the organs that make urine (kidneys). A kidney stone may form in a kidney and move into the bladder, where it can cause intense pain and block the flow of urine. Kidney stones are created when high levels of certain minerals are found in the urine. They are usually passed through urination, but in some cases, medical treatment may be needed to remove them. What are the causes? Kidney stones may be caused by:  A condition in which certain glands  produce too much parathyroid hormone (primary hyperparathyroidism), which causes too much calcium buildup in the blood.  Buildup of uric acid crystals in the bladder (hyperuricosuria). Uric acid is a chemical that the body produces when you eat certain foods. It usually exits the body in the urine.  Narrowing (stricture) of one or both of the tubes that drain urine from the kidneys to the bladder (ureters).  A kidney blockage that is present at birth (congenital obstruction).  Past surgery on the kidney or the ureters, such as gastric bypass surgery. What increases the risk? The following factors make you more likely to develop kidney stones:  Having had a kidney stone in the past.  Having a family history of kidney stones.  Not drinking enough water.  Eating a diet that is high in protein, salt (sodium), or sugar.  Being overweight or obese. What are the signs or symptoms? Symptoms of a kidney stone may include:  Nausea.  Vomiting.  Blood in the urine (hematuria).  Pain in the side of the abdomen, right below the ribs (flank pain). Pain usually spreads (radiates) to the groin.  Needing to urinate frequently or urgently. How is this diagnosed? This condition may be diagnosed based on:  Your medical history.  A physical exam.  Blood tests.  Urine tests.  CT scan.  Abdominal X-ray.  A procedure to examine the inside of the bladder (cystoscopy). How is this treated? Treatment for kidney stones depends on the size, location, and makeup of the stones. Treatment may involve:  Analyzing your urine before and after you pass the stone through urination.  Being monitored at the hospital until you pass the stone through urination.  Increasing your fluid intake and  decreasing the amount of calcium and protein in your diet.  A procedure to break up kidney stones in the bladder using: ? A focused beam of light (laser therapy). ? Shock waves (extracorporeal shock wave  lithotripsy).  Surgery to remove kidney stones. This may be needed if you have severe pain or have stones that block your urinary tract. Follow these instructions at home: Eating and drinking  Drink enough fluid to keep your urine clear or pale yellow. This will help you to pass the kidney stone.  If directed, change your diet. This may include: ? Limiting how much sodium you eat. ? Eating more fruits and vegetables. ? Limiting how much meat, poultry, fish, and eggs you eat.  Follow instructions from your health care provider about eating or drinking restrictions. General instructions  Collect urine samples as told by your health care provider. You may need to collect a urine sample: ? 24 hours after you pass the stone. ? 8-12 weeks after passing the kidney stone, and every 6-12 months after that.  Strain your urine every time you urinate, for as long as directed. Use the strainer that your health care provider recommends.  Do not throw out the kidney stone after passing it. Keep the stone so it can be tested by your health care provider. Testing the makeup of your kidney stone may help prevent you from getting kidney stones in the future.  Take over-the-counter and prescription medicines only as told by your health care provider.  Keep all follow-up visits as told by your health care provider. This is important. You may need follow-up X-rays or ultrasounds to make sure that your stone has passed. How is this prevented? To prevent another kidney stone:  Drink enough fluid to keep your urine clear or pale yellow. This is the best way to prevent kidney stones.  Eat a healthy diet and follow recommendations from your health care provider about foods to avoid. You may be instructed to eat a low-protein diet. Recommendations vary depending on the type of kidney stone that you have.  Maintain a healthy weight. Contact a health care provider if:  You have pain that gets worse or does  not get better with medicine. Get help right away if:  You have a fever or chills.  You develop severe pain.  You develop new abdominal pain.  You faint.  You are unable to urinate. This information is not intended to replace advice given to you by your health care provider. Make sure you discuss any questions you have with your health care provider. Document Released: 12/17/2005 Document Revised: 05/30/2017 Document Reviewed: 06/01/2016 Elsevier Interactive Patient Education  2019 [employer]   Flank Pain, Adult Flank pain is pain that is located on the side of the body between the upper abdomen and the back. This area is called the flank. The pain may occur over a short period of time (acute), or it may be long-term or recurring (chronic). It may be mild or severe. Flank pain can be caused by many things, including:  Muscle soreness or injury.  Kidney stones or kidney disease.  Stress.  A disease of the spine (vertebral disk disease).  A lung infection (pneumonia).  Fluid around the lungs (pulmonary edema).  A skin rash caused by the chickenpox virus (shingles).  Tumors that affect the back of the abdomen.  Gallbladder disease. Follow these instructions at home:   Drink enough fluid to keep your urine clear or pale yellow.  Rest  as told by your health care provider.  Take over-the-counter and prescription medicines only as told by your health care provider.  Keep a journal to track what has caused your flank pain and what has made it feel better.  Keep all follow-up visits as told by your health care provider. This is important. Contact a health care provider if:  Your pain is not controlled with medicine.  You have new symptoms.  Your pain gets worse.  You have a fever.  Your symptoms last longer than 2-3 days.  You have trouble urinating or you are urinating very frequently. Get help right away if:  You have trouble breathing or you are short of  breath.  Your abdomen hurts or it is swollen or red.  You have nausea or vomiting.  You feel faint or you pass out.  You have blood in your urine. Summary  Flank pain is pain that is located on the side of the body between the upper abdomen and the back.  The pain may occur over a short period of time (acute), or it may be long-term or recurring (chronic). It may be mild or severe.  Flank pain can be caused by many things.  Contact your health care provider if your symptoms get worse or they last longer than 2-3 days. This information is not intended to replace advice given to you by your health care provider. Make sure you discuss any questions you have with your health care provider. Document Released: 02/07/2006 Document Revised: 03/01/2017 Document Reviewed: 03/01/2017 Elsevier Interactive Patient Education  [employer].  If you have lab work done today you will be contacted with your lab results within the next 2 weeks.  If you have not heard from [place] then please contact us. The fastest way to get your results is to register for My Chart.   IF you received an x-ray today, you will receive an invoice from [facility]. Please contact [facility] at [phone] with questions or concerns regarding your invoice.   IF you received labwork today, you will receive an invoice from [facility]. Please contact [facility] at [phone] with questions or concerns regarding your invoice.   Our billing staff will not be able to assist you with questions regarding bills from these companies.  You will be contacted with the lab results as soon as they are available. The fastest way to get your results is to activate your My Chart account. Instructions are located on the last page of this paperwork. If you have not heard from [place] regarding the results in 2 weeks, please contact this office.       Signed,   Merri Ray, MD [facility].  05/05/19 1:28 PM

## 2019-05-05 NOTE — Patient Instructions (Addendum)
Based on your symptoms last night, I am suspicious for possible kidney stone that has likely passed as symptoms much improved today, and urinalysis without blood. Make sure to drink plenty of fluid.   If pain returns, can start flomax once per day and hydrocodone if needed.  Use the filter and if stone passed, then bring to office or urology. We can determine if urology appointment needed based on imaging result.   CT renal stone study ordered for [facility] - walk-in for study tomorrow morning or Thursday morning.   [place] [place], [place] [address]   Return to the clinic or go to the nearest emergency room if any of your symptoms worsen or new symptoms occur.   Kidney Stones  Kidney stones (urolithiasis) are solid, rock-like deposits that form inside of the organs that make urine (kidneys). A kidney stone may form in a kidney and move into the bladder, where it can cause intense pain and block the flow of urine. Kidney stones are created when high levels of certain minerals are found in the urine. They are usually passed through urination, but in some cases, medical treatment may be needed to remove them. What are the causes? Kidney stones may be caused by:  A condition in which certain glands produce too much parathyroid hormone (primary hyperparathyroidism), which causes too much calcium buildup in the blood.  Buildup of uric acid crystals in the bladder (hyperuricosuria). Uric acid is a chemical that the body produces when you eat certain foods. It usually exits the body in the urine.  Narrowing (stricture) of one or both of the tubes that drain urine from the kidneys to the bladder (ureters).  A kidney blockage that is present at birth (congenital obstruction).  Past surgery on the kidney or the ureters, such as gastric bypass surgery. What increases the risk? The following factors make you more likely to develop kidney stones:  Having had a kidney stone in the  past.  Having a family history of kidney stones.  Not drinking enough water.  Eating a diet that is high in protein, salt (sodium), or sugar.  Being overweight or obese. What are the signs or symptoms? Symptoms of a kidney stone may include:  Nausea.  Vomiting.  Blood in the urine (hematuria).  Pain in the side of the abdomen, right below the ribs (flank pain). Pain usually spreads (radiates) to the groin.  Needing to urinate frequently or urgently. How is this diagnosed? This condition may be diagnosed based on:  Your medical history.  A physical exam.  Blood tests.  Urine tests.  CT scan.  Abdominal X-ray.  A procedure to examine the inside of the bladder (cystoscopy). How is this treated? Treatment for kidney stones depends on the size, location, and makeup of the stones. Treatment may involve:  Analyzing your urine before and after you pass the stone through urination.  Being monitored at the hospital until you pass the stone through urination.  Increasing your fluid intake and decreasing the amount of calcium and protein in your diet.  A procedure to break up kidney stones in the bladder using: ? A focused beam of light (laser therapy). ? Shock waves (extracorporeal shock wave lithotripsy).  Surgery to remove kidney stones. This may be needed if you have severe pain or have stones that block your urinary tract. Follow these instructions at home: Eating and drinking  Drink enough fluid to keep your urine clear or pale yellow. This will help you  to pass the kidney stone.  If directed, change your diet. This may include: ? Limiting how much sodium you eat. ? Eating more fruits and vegetables. ? Limiting how much meat, poultry, fish, and eggs you eat.  Follow instructions from your health care provider about eating or drinking restrictions. General instructions  Collect urine samples as told by your health care provider. You may need to collect a urine  sample: ? 24 hours after you pass the stone. ? 8-12 weeks after passing the kidney stone, and every 6-12 months after that.  Strain your urine every time you urinate, for as long as directed. Use the strainer that your health care provider recommends.  Do not throw out the kidney stone after passing it. Keep the stone so it can be tested by your health care provider. Testing the makeup of your kidney stone may help prevent you from getting kidney stones in the future.  Take over-the-counter and prescription medicines only as told by your health care provider.  Keep all follow-up visits as told by your health care provider. This is important. You may need follow-up X-rays or ultrasounds to make sure that your stone has passed. How is this prevented? To prevent another kidney stone:  Drink enough fluid to keep your urine clear or pale yellow. This is the best way to prevent kidney stones.  Eat a healthy diet and follow recommendations from your health care provider about foods to avoid. You may be instructed to eat a low-protein diet. Recommendations vary depending on the type of kidney stone that you have.  Maintain a healthy weight. Contact a health care provider if:  You have pain that gets worse or does not get better with medicine. Get help right away if:  You have a fever or chills.  You develop severe pain.  You develop new abdominal pain.  You faint.  You are unable to urinate. This information is not intended to replace advice given to you by your health care provider. Make sure you discuss any questions you have with your health care provider. Document Released: 12/17/2005 Document Revised: 05/30/2017 Document Reviewed: 06/01/2016 Elsevier Interactive Patient Education  2019 Elsevier Inc.   Flank Pain, Adult Flank pain is pain that is located on the side of the body between the upper abdomen and the back. This area is called the flank. The pain may occur over a short  period of time (acute), or it may be long-term or recurring (chronic). It may be mild or severe. Flank pain can be caused by many things, including:  Muscle soreness or injury.  Kidney stones or kidney disease.  Stress.  A disease of the spine (vertebral disk disease).  A lung infection (pneumonia).  Fluid around the lungs (pulmonary edema).  A skin rash caused by the chickenpox virus (shingles).  Tumors that affect the back of the abdomen.  Gallbladder disease. Follow these instructions at home:   Drink enough fluid to keep your urine clear or pale yellow.  Rest as told by your health care provider.  Take over-the-counter and prescription medicines only as told by your health care provider.  Keep a journal to track what has caused your flank pain and what has made it feel better.  Keep all follow-up visits as told by your health care provider. This is important. Contact a health care provider if:  Your pain is not controlled with medicine.  You have new symptoms.  Your pain gets worse.  You have a fever.  Your symptoms last longer than 2-3 days.  You have trouble urinating or you are urinating very frequently. Get help right away if:  You have trouble breathing or you are short of breath.  Your abdomen hurts or it is swollen or red.  You have nausea or vomiting.  You feel faint or you pass out.  You have blood in your urine. Summary  Flank pain is pain that is located on the side of the body between the upper abdomen and the back.  The pain may occur over a short period of time (acute), or it may be long-term or recurring (chronic). It may be mild or severe.  Flank pain can be caused by many things.  Contact your health care provider if your symptoms get worse or they last longer than 2-3 days. This information is not intended to replace advice given to you by your health care provider. Make sure you discuss any questions you have with your health care  provider. Document Released: 02/07/2006 Document Revised: 03/01/2017 Document Reviewed: 03/01/2017 Elsevier Interactive Patient Education  [employer].  If you have lab work done today you will be contacted with your lab results within the next 2 weeks.  If you have not heard from [place] then please contact us. The fastest way to get your results is to register for My Chart.   IF you received an x-ray today, you will receive an invoice from [facility]. Please contact [facility] at [phone] with questions or concerns regarding your invoice.   IF you received labwork today, you will receive an invoice from [facility]. Please contact [facility] at [phone] with questions or concerns regarding your invoice.   Our billing staff will not be able to assist you with questions regarding bills from these companies.  You will be contacted with the lab results as soon as they are available. The fastest way to get your results is to activate your My Chart account. Instructions are located on the last page of this paperwork. If you have not heard from [place] regarding the results in 2 weeks, please contact this office.

## 2019-05-07 ENCOUNTER — Other Ambulatory Visit: Payer: Self-pay

## 2019-05-07 ENCOUNTER — Ambulatory Visit
Admission: RE | Admit: 2019-05-07 | Discharge: 2019-05-07 | Disposition: A | Discharge status: Home / Self Care | Payer: BLUE CROSS/BLUE SHIELD | Source: Ambulatory Visit | Attending: Family Medicine | Admitting: Family Medicine

## 2019-05-07 ENCOUNTER — Encounter: Payer: Self-pay | Admitting: Family Medicine

## 2019-05-07 ENCOUNTER — Telehealth: Payer: Self-pay | Admitting: Family Medicine

## 2019-05-07 DIAGNOSIS — Z87442 Personal history of urinary calculi: Secondary | ICD-10-CM

## 2019-05-07 DIAGNOSIS — N23 Unspecified renal colic: Secondary | ICD-10-CM

## 2019-05-07 DIAGNOSIS — R109 Unspecified abdominal pain: Secondary | ICD-10-CM

## 2019-05-07 NOTE — Telephone Encounter (Signed)
I see the message Leonard Henry had with pt earlier. He would like a CB at [phone]

## 2019-05-07 NOTE — Telephone Encounter (Signed)
Copied from [place] [id number]. Topic: Quick Communication - See Telephone Encounter >> May 07, 2019  1:30 PM Blase Mess A wrote: CRM for notification. See Telephone encounter for: 05/07/19.  Patient is calling to receive  imaging results from Dr. Carlota Raspberry. Please advise when the results are in [facility]- [phone] (M)

## 2019-07-02 ENCOUNTER — Telehealth: Payer: Self-pay | Admitting: Family Medicine

## 2019-07-02 NOTE — Telephone Encounter (Signed)
I have scheduled Mr. Leonard Henry for an in office appointment for allergies on Wednesday July 8. He states that he is having pain in his throat. I asked him if he was having any other symptoms like SOB, Cough, etc. Pt denies any of those symptoms. Please advise on to keep as an in office appt or if we need to switch it to a virtual. The patient is wanting to come into the office if possible.

## 2019-07-02 NOTE — Telephone Encounter (Signed)
In office ok if allergy symptoms but if any new/worsening symptoms such as fever or worse sore throat should be evaluated sooner. We can triage day of to make sure still ok to be seen in office if not worsening until that time.

## 2019-07-08 ENCOUNTER — Encounter: Payer: Self-pay | Admitting: Family Medicine

## 2019-07-08 ENCOUNTER — Ambulatory Visit: Payer: BC Managed Care – PPO | Admitting: Family Medicine

## 2019-07-08 ENCOUNTER — Other Ambulatory Visit: Payer: Self-pay

## 2019-07-08 VITALS — BP 109/78 | HR 100 | Temp 98.1°F | Ht 70.0 in | Wt 239.0 lb

## 2019-07-08 DIAGNOSIS — R509 Fever, unspecified: Secondary | ICD-10-CM

## 2019-07-08 DIAGNOSIS — J029 Acute pharyngitis, unspecified: Secondary | ICD-10-CM | POA: Diagnosis not present

## 2019-07-08 LAB — POCT CBC
Granulocyte percent: 55.6 %G (ref 37–80)
HCT, POC: 44 % — AB (ref 29–41)
Hemoglobin: 14.8 g/dL — AB (ref 11–14.6)
Lymph, poc: 2.6 (ref 0.6–3.4)
MCH, POC: 28.2 pg (ref 27–31.2)
MCHC: 33.6 g/dL (ref 31.8–35.4)
MCV: 83.8 fL (ref 76–111)
MID (cbc): 0.5 (ref 0–0.9)
MPV: 8.3 fL (ref 0–99.8)
POC Granulocyte: 3.9 (ref 2–6.9)
POC LYMPH PERCENT: 37.1 %L (ref 10–50)
POC MID %: 7.3 %M (ref 0–12)
Platelet Count, POC: 249 10*3/uL (ref 142–424)
RBC: 5.25 M/uL (ref 4.69–6.13)
RDW, POC: 13.8 %
WBC: 7 10*3/uL (ref 4.6–10.2)

## 2019-07-08 LAB — POCT RAPID STREP A (OFFICE): Rapid Strep A Screen: NEGATIVE

## 2019-07-08 NOTE — Progress Notes (Signed)
Subjective:    Patient ID: Leonard Henry, male    DOB: 09-27-1975, 44 y.o.   MRN: [id number]  HPI Leonard Henry is a 44 y.o. male Presents today for: Chief Complaint  Patient presents with  . Allergies    having sore throat body aches, no fever, has not been around anyon with the virus per pt. Monitors temp at [employer] with sore throat. Although not disclosed at check-in or during triage, I did note that he was evaluated today with [facility] in [place] with COVID-19 test obtained, nasal swab.  Gown, gloves, N95 mask and face shield worn during evaluation and throat swab.   Evaluated for sore throat in March with telemedicine visit.  Started 6 2000 days prior to that time.  Initial symptomatic care discussed with over-the-counter Claritin then plan for amoxicillin 500 mg 3 times daily for 10 days if not improving.  He did not take amoxicillin.  Reports persistent sore throat since that time, approximately 2 months.  Better and then worse at times.  No measured fever, but has had subjective fevers in the morning with associated body aches at that time only.  Has taken ibuprofen which helps some during the day.  Claritin helped somewhat symptoms but not resolved. Noticed symptoms after mowing grass earlier this season.  Denies rhinorrhea/postnasal drip/cough/dyspnea Denies nausea/vomiting/diarrhea, no known heartburn.  Denies change in taste or smell No known exposure to COVID-19 or sick contacts.  Drive-through testing performed [facility] No night sweats, no weight loss.   Patient Active Problem List   Diagnosis Date Noted  . Lateral epicondylitis of right elbow 02/05/2019  . Plantar fasciitis, bilateral 04/22/2018   Past Medical History:  Diagnosis Date  . Chest pain    No past surgical history on file. Allergies  Allergen Reactions  . Iopamidol Itching and Nausea And Vomiting   Prior to Admission medications   Medication Sig Start  Date End Date Taking? Authorizing Provider  acetaminophen (TYLENOL) 500 MG tablet Take 500 mg by mouth every 6 (six) hours as needed.   Yes [provider]  ibuprofen (ADVIL) 800 MG tablet Take 800 mg by mouth every 8 (eight) hours as needed.   Yes [provider]   Social History   Socioeconomic History  . Marital status: Married    Spouse name: Not on file  . Number of children: Not on file  . Years of education: Not on file  . Highest education level: Not on file  Occupational History  . Not on file  Social Needs  . Financial resource strain: Not on file  . Food insecurity    Worry: Not on file    Inability: Not on file  . Transportation needs    Medical: Not on file    Non-medical: Not on file  Tobacco Use  . Smoking status: Never Smoker  . Smokeless tobacco: Never Used  Substance and Sexual Activity  . Alcohol use: Yes  . Drug use: No  . Sexual activity: Not on file  Lifestyle  . Physical activity    Days per week: Not on file    Minutes per session: Not on file  . Stress: Not on file  Relationships  . Social Herbalist on phone: Not on file    Gets together: Not on file    Attends religious service: Not on file    Active member of club or organization: Not on  file    Attends meetings of clubs or organizations: Not on file    Relationship status: Not on file  . Intimate partner violence    Fear of current or ex partner: Not on file    Emotionally abused: Not on file    Physically abused: Not on file    Forced sexual activity: Not on file  Other Topics Concern  . Not on file  Social History Narrative  . Not on file    Review of Systems     Objective:   Physical Exam Vitals signs and nursing note reviewed.  Constitutional:      Appearance: He is well-developed.  HENT:     Head: Normocephalic and atraumatic.     Right Ear: Tympanic membrane, ear canal and external ear normal.     Left Ear: Tympanic membrane, ear canal and  external ear normal.     Nose: No rhinorrhea.     Mouth/Throat:     Mouth: Mucous membranes are moist.     Palate: No lesions.     Pharynx: Uvula midline. Posterior oropharyngeal erythema (Posterior oropharyngeal erythema without exudate) present. No oropharyngeal exudate or uvula swelling.     Tonsils: No tonsillar exudate or tonsillar abscesses.  Eyes:     Conjunctiva/sclera: Conjunctivae normal.     Pupils: Pupils are equal, round, and reactive to light.  Neck:     Musculoskeletal: Neck supple.     Trachea: Trachea normal.  Cardiovascular:     Rate and Rhythm: Normal rate and regular rhythm.     Heart sounds: Normal heart sounds. No murmur.  Pulmonary:     Effort: Pulmonary effort is normal.     Breath sounds: Normal breath sounds. No wheezing, rhonchi or rales.  Abdominal:     Palpations: Abdomen is soft.     Tenderness: There is no abdominal tenderness.  Lymphadenopathy:     Cervical: No cervical adenopathy.  Skin:    General: Skin is warm and dry.     Findings: No rash.  Neurological:     Mental Status: He is alert and oriented to person, place, and time.  Psychiatric:        Behavior: Behavior normal.    Vitals:   07/08/19 1359  BP: 109/78  Pulse: 100  Temp: 98.1 F (36.7 C)  TempSrc: Oral  SpO2: 96%  Weight: 239 lb (108.4 kg)  Height: 5\' 10"  (1.778 m)   Results for orders placed or performed in visit on 07/08/19  POCT rapid strep A  Result Value Ref Range   Rapid Strep A Screen Negative Negative  POCT CBC  Result Value Ref Range   WBC 7.0 4.6 - 10.2 K/uL   Lymph, poc 2.6 0.6 - 3.4   POC LYMPH PERCENT 37.1 10 - 50 %L   MID (cbc) 0.5 0 - 0.9   POC MID % 7.3 0 - 12 %M   POC Granulocyte 3.9 2 - 6.9   Granulocyte percent 55.6 37 - 80 %G   RBC 5.25 4.69 - 6.13 M/uL   Hemoglobin 14.8 (A) 11 - 14.6 g/dL   HCT, POC 44.0 (A) 29 - 41 %   MCV 83.8 76 - 111 fL   MCH, POC 28.2 27 - 31.2 pg   MCHC 33.6 31.8 - 35.4 g/dL   RDW, POC 13.8 %   Platelet Count, POC  249 142 - 424 K/uL   MPV 8.3 0 - 99.8 fL       Assessment &  Plan:   Leonard Henry is a 44 y.o. male Sore throat - Plan: POCT rapid strep A, POCT CBC, Culture, Group A Strep, Ambulatory referral to ENT  Feels feverish - Plan: POCT rapid strep A, POCT CBC, Culture, Group A Strep, Ambulatory referral to ENT  Approximately 2-3 month history of sore throat, subjective fevers feeling in the morning without any objective fever measured.  Initial symptoms reportedly started after mowing the lawn, possible allergic cause including with slight improvement with Claritin.  Some erythema noted posterior oropharynx without sign of abscess or exudate.  Clearing secretions, able to eat and drink without difficulty.  Afebrile, reassuring CBC, and negative strep testing in office.  -Check throat culture, but less likely infectious given normal CBC and negative rapid strep  -With slight improvement with Claritin, possible allergic cause, can add Flonase  -Symptoms more notable in the morning, differential includes reflux overnight, option of Pepcid over-the-counter temporarily.  -Refer to ENT for further evaluation with RTC/ER precautions if worsening sooner  -Had COVID-19 testing performed this morning at an outside facility, recommended self isolation until those results have returned, but unlikely cause of symptoms over the past few months.   No orders of the defined types were placed in this encounter.  Patient Instructions    Strep test was negative or normal in the office.  I will check a throat culture but does not appear to be infectious at this time.   Okay to continue Claritin, can try over-the-counter Flonase which can also help possible allergy symptoms.   You can try Pepcid over-the-counter which sometimes can help if there is some heartburn causing sore throat.   Until your COVID test has returned negative, I would recommend avoiding others/self isolating.   I will refer you to an ear nose and  throat specialist to also evaluate your symptoms.  Return to the clinic or go to the nearest emergency room if any of your symptoms worsen or new symptoms occur.   Sore Throat A sore throat is pain, burning, irritation, or scratchiness in the throat. When you have a sore throat, you may feel pain or tenderness in your throat when you swallow or talk. Many things can cause a sore throat, including:  An infection.  Seasonal allergies.  Dryness in the air.  Irritants, such as smoke or pollution.  Radiation treatment to the area.  Gastroesophageal reflux disease (GERD).  A tumor. A sore throat is often the first sign of another sickness. It may happen with other symptoms, such as coughing, sneezing, fever, and swollen neck glands. Most sore throats go away without medical treatment. Follow these instructions at home:      Take over-the-counter medicines only as told by your health care provider. ? If your child has a sore throat, do not give your child aspirin because of the association with Reye syndrome.  Drink enough fluids to keep your urine pale yellow.  Rest as needed.  To help with pain, try: ? Sipping warm liquids, such as broth, herbal tea, or warm water. ? Eating or drinking cold or frozen liquids, such as frozen ice pops. ? Gargling with a salt-water mixture 3-4 times a day or as needed. To make a salt-water mixture, completely dissolve -1 tsp (3-6 g) of salt in 1 cup (237 mL) of warm water. ? Sucking on hard candy or throat lozenges. ? Putting a cool-mist humidifier in your bedroom at night to moisten the air. ? Sitting in the bathroom with the door closed  for 5-10 minutes while you run hot water in the shower.  Do not use any products that contain nicotine or tobacco, such as cigarettes, e-cigarettes, and chewing tobacco. If you need help quitting, ask your health care provider.  Wash your hands well and often with soap and water. If soap and water are not  available, use hand sanitizer. Contact a health care provider if:  You have a fever for more than 2-3 days.  You have symptoms that last (are persistent) for more than 2-3 days.  Your throat does not get better within 7 days.  You have a fever and your symptoms suddenly get worse.  Your child who is 3 months to 51 years old has a temperature of 102.71F (39C) or higher. Get help right away if:  You have difficulty breathing.  You cannot swallow fluids, soft foods, or your saliva.  You have increased swelling in your throat or neck.  You have persistent nausea and vomiting. Summary  A sore throat is pain, burning, irritation, or scratchiness in the throat. Many things can cause a sore throat.  Take over-the-counter medicines only as told by your health care provider. Do not give your child aspirin.  Drink plenty of fluids, and rest as needed.  Contact a health care provider if your symptoms worsen or your sore throat does not get better within 7 days. This information is not intended to replace advice given to you by your health care provider. Make sure you discuss any questions you have with your health care provider. Document Released: 01/24/2005 Document Revised: 05/19/2018 Document Reviewed: 05/19/2018 Elsevier Patient Education  [employer].   If you have lab work done today you will be contacted with your lab results within the next 2 weeks.  If you have not heard from [place] then please contact us. The fastest way to get your results is to register for My Chart.   IF you received an x-ray today, you will receive an invoice from [facility]. Please contact [facility] at [phone] with questions or concerns regarding your invoice.   IF you received labwork today, you will receive an invoice from [facility]. Please contact [facility] at [phone] with questions or concerns regarding your invoice.   Our billing staff will not be able to assist you  with questions regarding bills from these companies.  You will be contacted with the lab results as soon as they are available. The fastest way to get your results is to activate your My Chart account. Instructions are located on the last page of this paperwork. If you have not heard from [place] regarding the results in 2 weeks, please contact this office.      Signed,   Merri Ray, MD [facility].  07/08/19 7:03 PM

## 2019-07-08 NOTE — Patient Instructions (Addendum)
Strep test was negative or normal in the office.  I will check a throat culture but does not appear to be infectious at this time.   Okay to continue Claritin, can try over-the-counter Flonase which can also help possible allergy symptoms.   You can try Pepcid over-the-counter which sometimes can help if there is some heartburn causing sore throat.   Until your COVID test has returned negative, I would recommend avoiding others/self isolating.   I will refer you to an ear nose and throat specialist to also evaluate your symptoms.  Return to the clinic or go to the nearest emergency room if any of your symptoms worsen or new symptoms occur.   Sore Throat A sore throat is pain, burning, irritation, or scratchiness in the throat. When you have a sore throat, you may feel pain or tenderness in your throat when you swallow or talk. Many things can cause a sore throat, including:  An infection.  Seasonal allergies.  Dryness in the air.  Irritants, such as smoke or pollution.  Radiation treatment to the area.  Gastroesophageal reflux disease (GERD).  A tumor. A sore throat is often the first sign of another sickness. It may happen with other symptoms, such as coughing, sneezing, fever, and swollen neck glands. Most sore throats go away without medical treatment. Follow these instructions at home:      Take over-the-counter medicines only as told by your health care provider. ? If your child has a sore throat, do not give your child aspirin because of the association with Reye syndrome.  Drink enough fluids to keep your urine pale yellow.  Rest as needed.  To help with pain, try: ? Sipping warm liquids, such as broth, herbal tea, or warm water. ? Eating or drinking cold or frozen liquids, such as frozen ice pops. ? Gargling with a salt-water mixture 3-4 times a day or as needed. To make a salt-water mixture, completely dissolve -1 tsp (3-6 g) of salt in 1 cup (237 mL) of warm  water. ? Sucking on hard candy or throat lozenges. ? Putting a cool-mist humidifier in your bedroom at night to moisten the air. ? Sitting in the bathroom with the door closed for 5-10 minutes while you run hot water in the shower.  Do not use any products that contain nicotine or tobacco, such as cigarettes, e-cigarettes, and chewing tobacco. If you need help quitting, ask your health care provider.  Wash your hands well and often with soap and water. If soap and water are not available, use hand sanitizer. Contact a health care provider if:  You have a fever for more than 2-3 days.  You have symptoms that last (are persistent) for more than 2-3 days.  Your throat does not get better within 7 days.  You have a fever and your symptoms suddenly get worse.  Your child who is 3 months to 6 years old has a temperature of 102.81F (39C) or higher. Get help right away if:  You have difficulty breathing.  You cannot swallow fluids, soft foods, or your saliva.  You have increased swelling in your throat or neck.  You have persistent nausea and vomiting. Summary  A sore throat is pain, burning, irritation, or scratchiness in the throat. Many things can cause a sore throat.  Take over-the-counter medicines only as told by your health care provider. Do not give your child aspirin.  Drink plenty of fluids, and rest as needed.  Contact a health care provider  if your symptoms worsen or your sore throat does not get better within 7 days. This information is not intended to replace advice given to you by your health care provider. Make sure you discuss any questions you have with your health care provider. Document Released: 01/24/2005 Document Revised: 05/19/2018 Document Reviewed: 05/19/2018 Elsevier Patient Education  [employer].   If you have lab work done today you will be contacted with your lab results within the next 2 weeks.  If you have not heard from [place] then please  contact us. The fastest way to get your results is to register for My Chart.   IF you received an x-ray today, you will receive an invoice from [facility]. Please contact [facility] at [phone] with questions or concerns regarding your invoice.   IF you received labwork today, you will receive an invoice from [facility]. Please contact [facility] at [phone] with questions or concerns regarding your invoice.   Our billing staff will not be able to assist you with questions regarding bills from these companies.  You will be contacted with the lab results as soon as they are available. The fastest way to get your results is to activate your My Chart account. Instructions are located on the last page of this paperwork. If you have not heard from [place] regarding the results in 2 weeks, please contact this office.

## 2019-07-10 LAB — CULTURE, GROUP A STREP: Strep A Culture: NEGATIVE

## 2020-01-18 ENCOUNTER — Other Ambulatory Visit: Payer: Self-pay

## 2020-01-18 ENCOUNTER — Encounter: Payer: BC Managed Care – PPO | Admitting: Family Medicine

## 2020-02-01 ENCOUNTER — Ambulatory Visit (INDEPENDENT_AMBULATORY_CARE_PROVIDER_SITE_OTHER): Payer: BC Managed Care – PPO | Admitting: Family Medicine

## 2020-02-01 ENCOUNTER — Other Ambulatory Visit: Payer: Self-pay

## 2020-02-01 ENCOUNTER — Encounter: Payer: Self-pay | Admitting: Family Medicine

## 2020-02-01 VITALS — BP 115/76 | HR 63 | Temp 98.6°F | Ht 70.0 in | Wt 239.0 lb

## 2020-02-01 DIAGNOSIS — Z8249 Family history of ischemic heart disease and other diseases of the circulatory system: Secondary | ICD-10-CM | POA: Diagnosis not present

## 2020-02-01 DIAGNOSIS — E782 Mixed hyperlipidemia: Secondary | ICD-10-CM

## 2020-02-01 DIAGNOSIS — Z23 Encounter for immunization: Secondary | ICD-10-CM

## 2020-02-01 DIAGNOSIS — Z0000 Encounter for general adult medical examination without abnormal findings: Secondary | ICD-10-CM | POA: Diagnosis not present

## 2020-02-01 DIAGNOSIS — K219 Gastro-esophageal reflux disease without esophagitis: Secondary | ICD-10-CM

## 2020-02-01 DIAGNOSIS — Z131 Encounter for screening for diabetes mellitus: Secondary | ICD-10-CM

## 2020-02-01 DIAGNOSIS — R739 Hyperglycemia, unspecified: Secondary | ICD-10-CM

## 2020-02-01 DIAGNOSIS — Z114 Encounter for screening for human immunodeficiency virus [HIV]: Secondary | ICD-10-CM

## 2020-02-01 NOTE — Progress Notes (Signed)
Subjective:  Patient ID: Leonard Henry, male    DOB: [birth year]  Age: 45 y.o. MRN: [id number]  CC:  Chief Complaint  Patient presents with  . Annual Exam    pt states he feels good today, other than a over the counter sleeping pill pt takes no medication. pt states he would like an EKG done today.    HPI Leonard Henry presents for   Annual physical exam  GERD: Burning sensation with lying down. Takes famotidine for laryngopharyngeal reflux. Seen by ENT in 07/31/19 with laryngoscopy. Controlled on meds - recurs if missing day or two.  Eats 3-4 hrs before sleep.   Requests EKG.  Family history of coronary disease: father with MI at 68yo. Mother with stents at age 82, now s/p bypass.  No chest pains.   Hyperglycemia: Glucose 105 in Jan last year.   Wt Readings from Last 3 Encounters:  02/01/20 239 lb (108.4 kg)  07/08/19 239 lb (108.4 kg)  05/05/19 240 lb (108.9 kg)     Hyperlipidemia: Not currently on medications.  Last tested in January last year. No regular exercise.  The 10-year ASCVD risk score Leonard Henry., et al., 2013) is: 2.4%   Values used to calculate the score:     Age: 68 years     Sex: Male     Is Non-Hispanic African American: No     Diabetic: No     Tobacco smoker: No     Systolic Blood Pressure: [id number] mmHg     Is BP treated: No     HDL Cholesterol: 36 mg/dL     Total Cholesterol: 204 mg/dL  Lab Results  Component Value Date   CHOL 204 (H) 01/21/2019   HDL 36 (L) 01/21/2019   LDLCALC 113 (H) 01/21/2019   TRIG 274 (H) 01/21/2019   CHOLHDL 5.7 (H) 01/21/2019   Lab Results  Component Value Date   ALT 27 01/21/2019   AST 19 01/21/2019   ALKPHOS 84 01/21/2019   BILITOT 0.9 01/21/2019    Cancer screening: no FH of cancer. No urinary issues.   Immunization History  Administered Date(s) Administered  . Influenza-Unspecified 08/29/2017  Due for Tdap, and flu vaccine. - will have today.   No history of HIV screening test on file.  Depression screen  [facility] 2/9 02/01/2020 07/08/2019 05/05/2019 03/30/2019 02/03/2019  Decreased Interest 0 0 0 0 0  Down, Depressed, Hopeless 0 0 0 0 0  PHQ - 2 Score 0 0 0 0 0     Hearing Screening   125Hz  250Hz  500Hz  1000Hz  2000Hz  3000Hz  4000Hz  6000Hz  8000Hz   Right ear:           Left ear:             Visual Acuity Screening   Right eye Left eye Both eyes  Without correction: 20/13 20/15 20/13   With correction:     no optho recently, last appt 1 year ago.  Needs glasses for readings.   Dental: no current dentist.   Exercise:none currently.   History Patient Active Problem List   Diagnosis Date Noted  . Lateral epicondylitis of right elbow 02/05/2019  . Plantar fasciitis, bilateral 04/22/2018   Past Medical History:  Diagnosis Date  . Chest pain    No past surgical history on file. Allergies  Allergen Reactions  . Iopamidol Itching and Nausea And Vomiting   Prior to Admission medications   Medication Sig Start Date End Date Taking? Authorizing Provider  acetaminophen (TYLENOL)  500 MG tablet Take 500 mg by mouth every 6 (six) hours as needed.    [provider]  ibuprofen (ADVIL) 800 MG tablet Take 800 mg by mouth every 8 (eight) hours as needed.    [provider]   Social History   Socioeconomic History  . Marital status: Married    Spouse name: Not on file  . Number of children: Not on file  . Years of education: Not on file  . Highest education level: Not on file  Occupational History  . Not on file  Tobacco Use  . Smoking status: Never Smoker  . Smokeless tobacco: Never Used  Substance and Sexual Activity  . Alcohol use: Yes  . Drug use: No  . Sexual activity: Not on file  Other Topics Concern  . Not on file  Social History Narrative  . Not on file   Social Determinants of Health   Financial Resource Strain:   . Difficulty of Paying Living Expenses: Not on file  Food Insecurity:   . Worried About Charity fundraiser in the Last Year: Not on file  . Ran Out  of Food in the Last Year: Not on file  Transportation Needs:   . Lack of Transportation (Medical): Not on file  . Lack of Transportation (Non-Medical): Not on file  Physical Activity:   . Days of Exercise per Week: Not on file  . Minutes of Exercise per Session: Not on file  Stress:   . Feeling of Stress : Not on file  Social Connections:   . Frequency of Communication with Friends and Family: Not on file  . Frequency of Social Gatherings with Friends and Family: Not on file  . Attends Religious Services: Not on file  . Active Member of Clubs or Organizations: Not on file  . Attends Archivist Meetings: Not on file  . Marital Status: Not on file  Intimate Partner Violence:   . Fear of Current or Ex-Partner: Not on file  . Emotionally Abused: Not on file  . Physically Abused: Not on file  . Sexually Abused: Not on file    Review of Systems  Constitutional: Negative for fatigue and unexpected weight change.  Eyes: Negative for visual disturbance.  Respiratory: Negative for cough, chest tightness and shortness of breath.   Cardiovascular: Negative for chest pain, palpitations and leg swelling.  Gastrointestinal: Negative for abdominal pain and blood in stool.  Neurological: Negative for dizziness, light-headedness and headaches.     Objective:   Vitals:   02/01/20 0931  BP: 115/76  Pulse: 63  Temp: 98.6 F (37 C)  TempSrc: Temporal  SpO2: 97%  Weight: 239 lb (108.4 kg)  Height: 5\' 10"  (1.778 m)    Physical Exam Vitals reviewed.  Constitutional:      Appearance: He is well-developed.  HENT:     Head: Normocephalic and atraumatic.     Right Ear: External ear normal.     Left Ear: External ear normal.  Eyes:     Conjunctiva/sclera: Conjunctivae normal.     Pupils: Pupils are equal, round, and reactive to light.  Neck:     Thyroid: No thyromegaly.  Cardiovascular:     Rate and Rhythm: Normal rate and regular rhythm.     Heart sounds: Normal heart  sounds.  Pulmonary:     Effort: Pulmonary effort is normal. No respiratory distress.     Breath sounds: Normal breath sounds. No wheezing.  Abdominal:  General: There is no distension.     Palpations: Abdomen is soft.     Tenderness: There is no abdominal tenderness.  Musculoskeletal:        General: No tenderness. Normal range of motion.     Cervical back: Normal range of motion and neck supple.  Lymphadenopathy:     Cervical: No cervical adenopathy.  Skin:    General: Skin is warm and dry.  Neurological:     Mental Status: He is alert and oriented to person, place, and time.     Deep Tendon Reflexes: Reflexes are normal and symmetric.  Psychiatric:        Behavior: Behavior normal.    EKG:  Sinus bradycardia, rate 54, nonspecific T wave lead III, flat in Avf. No acute findings. Unchanged from 12/2018.   Assessment & Plan:  Danen Nicanor is a 45 y.o. male . Annual physical exam - Plan: EKG 12-Lead  - -anticipatory guidance as below in AVS, screening labs above. Health maintenance items as above in HPI discussed/recommended as applicable.   Need for prophylactic vaccination and inoculation against influenza - Plan: Flu Vaccine QUAD 6+ mos PF IM (Fluarix Quad PF)  Need for prophylactic vaccination with combined diphtheria-tetanus-pertussis (DTP) vaccine - Plan: Tdap vaccine greater than or equal to 7yo IM  Laryngopharyngeal reflux  -Famotidine as needed, trigger avoidance discussed with handout given.  Hyperglycemia - Plan: Hemoglobin A1c Screening for diabetes mellitus - Plan: Hemoglobin A1c  -Exercise discussed, 150 minutes/week, low intensity initially with slow increase.  -check A1c.   Mixed hyperlipidemia - Plan: EKG 12-Lead, Comprehensive metabolic panel, Lipid panel Family history of coronary artery disease in father - Plan: EKG 12-Lead Family history of coronary artery disease in mother - Plan: EKG 12-Lead  -Repeat lipid panel, CMP.  Low ASCVD risk for previously,  but with family history of coronary disease in both mother and father, with have a lower threshold to start a statin.  Additionally stressed importance of weight loss with previous hyperglycemia  screening for HIV (human immunodeficiency virus) - Plan: HIV Antibody (routine testing w rflx)   No orders of the defined types were placed in this encounter.  Patient Instructions    Continue famotidine for heartburn/reflux. See info below. Return to the clinic or go to the nearest emergency room if any of your symptoms worsen or new symptoms occur.  Depending on cholesterol level, I would recommend treatment with statin medicine due to your family history of heart disease. I will llet you know once I review the results.   Eye doctors: [facility].  [facility].   I recommend dental appointment. Let me know if you need some names.   Thank you for coming in today. Plan on recheck in 6 months at this time.    Food Choices for Gastroesophageal Reflux Disease, Adult When you have gastroesophageal reflux disease (GERD), the foods you eat and your eating habits are very important. Choosing the right foods can help ease the discomfort of GERD. Consider working with a diet and nutrition specialist (dietitian) to help you make healthy food choices. What general guidelines should I follow?  Eating plan  Choose healthy foods low in fat, such as fruits, vegetables, whole grains, low-fat dairy products, and lean meat, fish, and poultry.  Eat frequent, small meals instead of three large meals each day. Eat your meals slowly, in a relaxed setting. Avoid bending over or lying down until 2-3 hours after eating.  Limit high-fat foods  such as fatty meats or fried foods.  Limit your intake of oils, butter, and shortening to less than 8 teaspoons each day.  Avoid the following: ? Foods that cause symptoms. These may be different for different people. Keep a food diary to keep track  of foods that cause symptoms. ? Alcohol. ? Drinking large amounts of liquid with meals. ? Eating meals during the 2-3 hours before bed.  Cook foods using methods other than frying. This may include baking, grilling, or broiling. Lifestyle  Maintain a healthy weight. Ask your health care provider what weight is healthy for you. If you need to lose weight, work with your health care provider to do so safely.  Exercise for at least 30 minutes on 5 or more days each week, or as told by your health care provider.  Avoid wearing clothes that fit tightly around your waist and chest.  Do not use any products that contain nicotine or tobacco, such as cigarettes and e-cigarettes. If you need help quitting, ask your health care provider.  Sleep with the head of your bed raised. Use a wedge under the mattress or blocks under the bed frame to raise the head of the bed. What foods are not recommended? The items listed may not be a complete list. Talk with your dietitian about what dietary choices are best for you. Grains Pastries or quick breads with added fat. [place] toast. Vegetables Deep fried vegetables. [place] fries. Any vegetables prepared with added fat. Any vegetables that cause symptoms. For some people this may include tomatoes and tomato products, chili peppers, onions and garlic, and horseradish. Fruits Any fruits prepared with added fat. Any fruits that cause symptoms. For some people this may include citrus fruits, such as oranges, grapefruit, pineapple, and lemons. Meats and other protein foods High-fat meats, such as fatty beef or pork, hot dogs, ribs, ham, sausage, salami and bacon. Fried meat or protein, including fried fish and fried chicken. Nuts and nut butters. Dairy Whole milk and chocolate milk. Sour cream. Cream. Ice cream. Cream cheese. Milk shakes. Beverages Coffee and tea, with or without caffeine. Carbonated beverages. Sodas. Energy drinks. Fruit juice made with acidic  fruits (such as orange or grapefruit). Tomato juice. Alcoholic drinks. Fats and oils Butter. Margarine. Shortening. Ghee. Sweets and desserts Chocolate and cocoa. Donuts. Seasoning and other foods Pepper. Peppermint and spearmint. Any condiments, herbs, or seasonings that cause symptoms. For some people, this may include curry, hot sauce, or vinegar-based salad dressings. Summary  When you have gastroesophageal reflux disease (GERD), food and lifestyle choices are very important to help ease the discomfort of GERD.  Eat frequent, small meals instead of three large meals each day. Eat your meals slowly, in a relaxed setting. Avoid bending over or lying down until 2-3 hours after eating.  Limit high-fat foods such as fatty meat or fried foods. This information is not intended to replace advice given to you by your health care provider. Make sure you discuss any questions you have with your health care provider. Document Revised: 04/09/2019 Document Reviewed: 12/18/2016 Elsevier Patient Education  [employer].    If you have lab work done today you will be contacted with your lab results within the next 2 weeks.  If you have not heard from [place] then please contact us. The fastest way to get your results is to register for My Chart.   IF you received an x-ray today, you will receive an invoice from [facility]. Please contact [facility]  [facility] at [phone] with questions or concerns regarding your invoice.   IF you received labwork today, you will receive an invoice from [place] [facility]. Please contact [facility] at [phone] with questions or concerns regarding your invoice.   Our billing staff will not be able to assist you with questions regarding bills from these companies.  You will be contacted with the lab results as soon as they are available. The fastest way to get your results is to activate your My Chart account. Instructions are located on the last page of this  paperwork. If you have not heard from [place] regarding the results in 2 weeks, please contact this office.         Signed, Merri Ray, MD [facility]

## 2020-02-01 NOTE — Patient Instructions (Addendum)
Continue famotidine for heartburn/reflux. See info below. Return to the clinic or go to the nearest emergency room if any of your symptoms worsen or new symptoms occur.  Depending on cholesterol level, I would recommend treatment with statin medicine due to your family history of heart disease. I will llet you know once I review the results.   Eye doctors: [facility].  [facility].   I recommend dental appointment. Let me know if you need some names.   Thank you for coming in today. Plan on recheck in 6 months at this time.    Food Choices for Gastroesophageal Reflux Disease, Adult When you have gastroesophageal reflux disease (GERD), the foods you eat and your eating habits are very important. Choosing the right foods can help ease the discomfort of GERD. Consider working with a diet and nutrition specialist (dietitian) to help you make healthy food choices. What general guidelines should I follow?  Eating plan  Choose healthy foods low in fat, such as fruits, vegetables, whole grains, low-fat dairy products, and lean meat, fish, and poultry.  Eat frequent, small meals instead of three large meals each day. Eat your meals slowly, in a relaxed setting. Avoid bending over or lying down until 2-3 hours after eating.  Limit high-fat foods such as fatty meats or fried foods.  Limit your intake of oils, butter, and shortening to less than 8 teaspoons each day.  Avoid the following: ? Foods that cause symptoms. These may be different for different people. Keep a food diary to keep track of foods that cause symptoms. ? Alcohol. ? Drinking large amounts of liquid with meals. ? Eating meals during the 2-3 hours before bed.  Cook foods using methods other than frying. This may include baking, grilling, or broiling. Lifestyle  Maintain a healthy weight. Ask your health care provider what weight is healthy for you. If you need to lose weight, work with your health  care provider to do so safely.  Exercise for at least 30 minutes on 5 or more days each week, or as told by your health care provider.  Avoid wearing clothes that fit tightly around your waist and chest.  Do not use any products that contain nicotine or tobacco, such as cigarettes and e-cigarettes. If you need help quitting, ask your health care provider.  Sleep with the head of your bed raised. Use a wedge under the mattress or blocks under the bed frame to raise the head of the bed. What foods are not recommended? The items listed may not be a complete list. Talk with your dietitian about what dietary choices are best for you. Grains Pastries or quick breads with added fat. [place] toast. Vegetables Deep fried vegetables. [place] fries. Any vegetables prepared with added fat. Any vegetables that cause symptoms. For some people this may include tomatoes and tomato products, chili peppers, onions and garlic, and horseradish. Fruits Any fruits prepared with added fat. Any fruits that cause symptoms. For some people this may include citrus fruits, such as oranges, grapefruit, pineapple, and lemons. Meats and other protein foods High-fat meats, such as fatty beef or pork, hot dogs, ribs, ham, sausage, salami and bacon. Fried meat or protein, including fried fish and fried chicken. Nuts and nut butters. Dairy Whole milk and chocolate milk. Sour cream. Cream. Ice cream. Cream cheese. Milk shakes. Beverages Coffee and tea, with or without caffeine. Carbonated beverages. Sodas. Energy drinks. Fruit juice made with acidic fruits (such as orange or grapefruit).  Tomato juice. Alcoholic drinks. Fats and oils Butter. Margarine. Shortening. Ghee. Sweets and desserts Chocolate and cocoa. Donuts. Seasoning and other foods Pepper. Peppermint and spearmint. Any condiments, herbs, or seasonings that cause symptoms. For some people, this may include curry, hot sauce, or vinegar-based salad  dressings. Summary  When you have gastroesophageal reflux disease (GERD), food and lifestyle choices are very important to help ease the discomfort of GERD.  Eat frequent, small meals instead of three large meals each day. Eat your meals slowly, in a relaxed setting. Avoid bending over or lying down until 2-3 hours after eating.  Limit high-fat foods such as fatty meat or fried foods. This information is not intended to replace advice given to you by your health care provider. Make sure you discuss any questions you have with your health care provider. Document Revised: 04/09/2019 Document Reviewed: 12/18/2016 Elsevier Patient Education  [employer].    If you have lab work done today you will be contacted with your lab results within the next 2 weeks.  If you have not heard from [place] then please contact us. The fastest way to get your results is to register for My Chart.   IF you received an x-ray today, you will receive an invoice from [facility]. Please contact [facility] at [phone] with questions or concerns regarding your invoice.   IF you received labwork today, you will receive an invoice from [place]. Please contact [facility] at [phone] with questions or concerns regarding your invoice.   Our billing staff will not be able to assist you with questions regarding bills from these companies.  You will be contacted with the lab results as soon as they are available. The fastest way to get your results is to activate your My Chart account. Instructions are located on the last page of this paperwork. If you have not heard from [place] regarding the results in 2 weeks, please contact this office.

## 2020-02-02 LAB — COMPREHENSIVE METABOLIC PANEL
ALT: 22 IU/L (ref 0–44)
AST: 17 IU/L (ref 0–40)
Albumin/Globulin Ratio: 1.6 (ref 1.2–2.2)
Albumin: 4.3 g/dL (ref 4.0–5.0)
Alkaline Phosphatase: 79 IU/L (ref 39–117)
BUN/Creatinine Ratio: 19 (ref 9–20)
BUN: 17 mg/dL (ref 6–24)
Bilirubin Total: 0.5 mg/dL (ref 0.0–1.2)
CO2: 21 mmol/L (ref 20–29)
Calcium: 9.7 mg/dL (ref 8.7–10.2)
Chloride: 104 mmol/L (ref 96–106)
Creatinine, Ser: 0.91 mg/dL (ref 0.76–1.27)
GFR calc Af Amer: 118 mL/min/{1.73_m2} (ref >59)
GFR calc non Af Amer: 102 mL/min/{1.73_m2} (ref >59)
Globulin, Total: 2.7 g/dL (ref 1.5–4.5)
Glucose: 100 mg/dL — ABNORMAL HIGH (ref 65–99)
Potassium: 4.8 mmol/L (ref 3.5–5.2)
Sodium: 140 mmol/L (ref 134–144)
Total Protein: 7 g/dL (ref 6.0–8.5)

## 2020-02-02 LAB — LIPID PANEL
Chol/HDL Ratio: 4.5 ratio (ref 0.0–5.0)
Cholesterol, Total: 185 mg/dL (ref 100–199)
HDL: 41 mg/dL (ref >39)
LDL Chol Calc (NIH): 124 mg/dL — ABNORMAL HIGH (ref 0–99)
Triglycerides: 109 mg/dL (ref 0–149)
VLDL Cholesterol Cal: 20 mg/dL (ref 5–40)

## 2020-02-02 LAB — HIV ANTIBODY (ROUTINE TESTING W REFLEX): HIV Screen 4th Generation wRfx: NONREACTIVE

## 2020-02-02 LAB — HEMOGLOBIN A1C
Est. average glucose Bld gHb Est-mCnc: 120 mg/dL
Hgb A1c MFr Bld: 5.8 % — ABNORMAL HIGH (ref 4.8–5.6)

## 2020-07-18 ENCOUNTER — Encounter (HOSPITAL_COMMUNITY): Payer: Self-pay | Admitting: Emergency Medicine

## 2020-07-18 ENCOUNTER — Emergency Department (HOSPITAL_COMMUNITY): Payer: BC Managed Care – PPO

## 2020-07-18 ENCOUNTER — Other Ambulatory Visit: Payer: Self-pay

## 2020-07-18 ENCOUNTER — Emergency Department (HOSPITAL_COMMUNITY)
Admission: EM | Admit: 2020-07-18 | Discharge: 2020-07-19 | Disposition: A | Discharge status: Home / Self Care | Payer: BC Managed Care – PPO | Attending: Emergency Medicine | Admitting: Emergency Medicine

## 2020-07-18 DIAGNOSIS — R0781 Pleurodynia: Secondary | ICD-10-CM | POA: Insufficient documentation

## 2020-07-18 DIAGNOSIS — R079 Chest pain, unspecified: Secondary | ICD-10-CM | POA: Diagnosis present

## 2020-07-18 LAB — CBC
HCT: 43.1 % (ref 39.0–52.0)
Hemoglobin: 14.8 g/dL (ref 13.0–17.0)
MCH: 28.1 pg (ref 26.0–34.0)
MCHC: 34.3 g/dL (ref 30.0–36.0)
MCV: 81.9 fL (ref 80.0–100.0)
Platelets: 265 10*3/uL (ref 150–400)
RBC: 5.26 MIL/uL (ref 4.22–5.81)
RDW: 13.4 % (ref 11.5–15.5)
WBC: 9.8 10*3/uL (ref 4.0–10.5)
nRBC: 0 % (ref 0.0–0.2)

## 2020-07-18 LAB — TROPONIN I (HIGH SENSITIVITY)
Troponin I (High Sensitivity): <2 ng/L (ref <18)
Troponin I (High Sensitivity): <2 ng/L

## 2020-07-18 LAB — BASIC METABOLIC PANEL WITH GFR
Anion gap: 8 (ref 5–15)
BUN: 12 mg/dL (ref 6–20)
CO2: 26 mmol/L (ref 22–32)
Calcium: 9.6 mg/dL (ref 8.9–10.3)
Chloride: 104 mmol/L (ref 98–111)
Creatinine, Ser: 0.87 mg/dL (ref 0.61–1.24)
GFR calc Af Amer: >60 mL/min
GFR calc non Af Amer: >60 mL/min
Glucose, Bld: 128 mg/dL — ABNORMAL HIGH (ref 70–99)
Potassium: 3.3 mmol/L — ABNORMAL LOW (ref 3.5–5.1)
Sodium: 138 mmol/L (ref 135–145)

## 2020-07-18 MED ORDER — SODIUM CHLORIDE 0.9% FLUSH: 3.0000 mL | Freq: Once | INTRAVENOUS | Status: DC

## 2020-07-18 NOTE — ED Triage Notes (Signed)
Pt c/o left side cp for a week, no sob, no nausea or vomiting.

## 2020-07-19 ENCOUNTER — Emergency Department (HOSPITAL_COMMUNITY): Payer: BC Managed Care – PPO

## 2020-07-19 LAB — HEPATIC FUNCTION PANEL
ALT: 23 U/L (ref 0–44)
AST: 18 U/L (ref 15–41)
Albumin: 3.7 g/dL (ref 3.5–5.0)
Alkaline Phosphatase: 61 U/L (ref 38–126)
Bilirubin, Direct: 0.2 mg/dL (ref 0.0–0.2)
Indirect Bilirubin: 0.7 mg/dL (ref 0.3–0.9)
Total Bilirubin: 0.9 mg/dL (ref 0.3–1.2)
Total Protein: 6.6 g/dL (ref 6.5–8.1)

## 2020-07-19 LAB — LIPASE, BLOOD: Lipase: 55 U/L — ABNORMAL HIGH (ref 11–51)

## 2020-07-19 MED ORDER — METHOCARBAMOL 500 MG PO TABS
500.0000 mg | ORAL_TABLET | Freq: Once | ORAL | Status: AC
  Administered 2020-07-19: 500 mg via ORAL
  Filled 2020-07-19: qty 1

## 2020-07-19 MED ORDER — IOHEXOL 350 MG/ML SOLN
100.0000 mL | Freq: Once | INTRAVENOUS | Status: AC | PRN
  Administered 2020-07-19: 75 mL via INTRAVENOUS

## 2020-07-19 MED ORDER — DIPHENHYDRAMINE HCL 25 MG PO CAPS
50.0000 mg | ORAL_CAPSULE | Freq: Once | ORAL | Status: DC
  Filled 2020-07-19: qty 2

## 2020-07-19 MED ORDER — KETOROLAC TROMETHAMINE 30 MG/ML IJ SOLN
30.0000 mg | Freq: Once | INTRAMUSCULAR | Status: AC
  Administered 2020-07-19: 30 mg via INTRAVENOUS
  Filled 2020-07-19: qty 1

## 2020-07-19 MED ORDER — HYDROCORTISONE NA SUCCINATE PF 250 MG IJ SOLR
200.0000 mg | Freq: Once | INTRAMUSCULAR | Status: AC
  Administered 2020-07-19: 200 mg via INTRAVENOUS
  Filled 2020-07-19: qty 200

## 2020-07-19 MED ORDER — ALUM & MAG HYDROXIDE-SIMETH 200-200-20 MG/5ML PO SUSP
30.0000 mL | Freq: Once | ORAL | Status: AC
  Administered 2020-07-19: 30 mL via ORAL
  Filled 2020-07-19: qty 30

## 2020-07-19 MED ORDER — DIPHENHYDRAMINE HCL 50 MG/ML IJ SOLN
50.0000 mg | Freq: Once | INTRAMUSCULAR | Status: AC
  Administered 2020-07-19: 50 mg via INTRAVENOUS
  Filled 2020-07-19: qty 1

## 2020-07-19 MED ORDER — ONDANSETRON HCL 4 MG/2ML IJ SOLN
4.0000 mg | Freq: Once | INTRAMUSCULAR | Status: AC
  Administered 2020-07-19: 4 mg via INTRAVENOUS
  Filled 2020-07-19: qty 2

## 2020-07-19 NOTE — ED Provider Notes (Signed)
Care assumed from Dr. Dayna Barker at shift change.  Patient awaiting results of CTA of the chest to rule out PE.  Patient reports pain to the left chest intermittently for the past week with no shortness of breath, nausea, diaphoresis, or exertional component.  CTA has returned and is negative.  Patient reassessed with stable vital signs and no hypoxia.  There was the mention of bibasilar opacities with differential including atelectasis versus atypical infection.  Patient is afebrile, denies cough, and infection does not fit the clinical picture.  He did have COVID-19 last year but recovered without complication.  I highly doubt this is the case now.  At this point, I feel as though discharge is appropriate.  If patient continues to have discomfort, I will have him follow-up with cardiology.  In the meantime, if his symptoms worsen he is to return to the ER for additional evaluation.   Veryl Speak, MD 07/19/20 [phone]

## 2020-07-19 NOTE — ED Provider Notes (Signed)
[facility] EMERGENCY DEPARTMENT Provider Note   CSN: [id number] Arrival date & time: 07/18/20  1733     History Chief Complaint  Patient presents with  . Chest Pain    Leonard Henry is a 45 y.o. male.   Chest Pain Pain location:  Substernal area and epigastric Pain quality: aching and sharp   Pain radiates to:  Does not radiate Pain severity:  Mild Duration:  1 week Progression:  Waxing and waning Chronicity:  New Context: breathing (sometimes makes it worse)   Context: not intercourse and not lifting   Relieved by:  None tried Worsened by:  Nothing Ineffective treatments:  None tried Associated symptoms: no abdominal pain, no dizziness, no fever and no headache        Past Medical History:  Diagnosis Date  . Chest pain     Patient Active Problem List   Diagnosis Date Noted  . Lateral epicondylitis of right elbow 02/05/2019  . Plantar fasciitis, bilateral 04/22/2018    History reviewed. No pertinent surgical history.     Family History  Problem Relation Age of Onset  . Diabetes Mother   . Heart disease Mother   . Hyperlipidemia Mother   . Heart disease Father     Social History   Tobacco Use  . Smoking status: Never Smoker  . Smokeless tobacco: Never Used  Vaping Use  . Vaping Use: Never used  Substance Use Topics  . Alcohol use: Yes  . Drug use: No    Home Medications Prior to Admission medications   Medication Sig Start Date End Date Taking? Authorizing Provider  acetaminophen (TYLENOL) 500 MG tablet Take 500 mg by mouth every 6 (six) hours as needed.    [provider]  famotidine (PEPCID) 20 mg Inject 20 mg into the vein every 12 (twelve) hours.    [provider]  ibuprofen (ADVIL) 800 MG tablet Take 800 mg by mouth every 8 (eight) hours as needed.    [provider]    Allergies    Iopamidol  Review of Systems   Review of Systems  Constitutional: Negative for fever.  Cardiovascular:  Positive for chest pain.  Gastrointestinal: Negative for abdominal pain.  Neurological: Negative for dizziness and headaches.  All other systems reviewed and are negative.   Physical Exam Updated Vital Signs BP (!) 107/50   Pulse (!) 48   Temp 98 F (36.7 C)   Resp 14   Ht 5\' 10"  (1.778 m)   Wt 108.9 kg   SpO2 98%   BMI 34.44 kg/m   Physical Exam Vitals and nursing note reviewed.  Constitutional:      Appearance: He is well-developed.  HENT:     Head: Normocephalic and atraumatic.  Cardiovascular:     Rate and Rhythm: Normal rate.  Pulmonary:     Effort: Pulmonary effort is normal. No respiratory distress.     Breath sounds: No decreased breath sounds or wheezing.  Chest:     Chest wall: No tenderness or crepitus.  Abdominal:     General: There is no distension.     Palpations: Abdomen is soft.  Musculoskeletal:        General: Normal range of motion.     Cervical back: Normal range of motion.  Skin:    General: Skin is warm and dry.  Neurological:     General: No focal deficit present.     Mental Status: He is alert.     ED  Results / Procedures / Treatments   Labs (all labs ordered are listed, but only abnormal results are displayed) Labs Reviewed  BASIC METABOLIC PANEL - Abnormal; Notable for the following components:      Result Value   Potassium 3.3 (*)    Glucose, Bld 128 (*)    All other components within normal limits  LIPASE, BLOOD - Abnormal; Notable for the following components:   Lipase 55 (*)    All other components within normal limits  CBC  HEPATIC FUNCTION PANEL  TROPONIN I (HIGH SENSITIVITY)  TROPONIN I (HIGH SENSITIVITY)    EKG EKG Interpretation  Date/Time:  Monday July 18 2020 17:40:52 EDT Ventricular Rate:  66 PR Interval:  208 QRS Duration: 100 QT Interval:  412 QTC Calculation: 431 R Axis:   -2 Text Interpretation: Normal sinus rhythm Low voltage QRS Septal infarct , age undetermined Abnormal ECG No old tracing to compare  Confirmed by Merrily Pew [phone]) on 07/19/2020 2:26:40 AM   Radiology DG Chest 2 View  Result Date: 07/18/2020 CLINICAL DATA:  Chest pain radiating to the left side for 10 days EXAM: CHEST - 2 VIEW COMPARISON:  Radiograph 01/21/2019 FINDINGS: Mild airways thickening. Low lung volumes. Streaky and patchy basilar opacities could reflect further atelectatic change or developing airspace disease. No pneumothorax or effusion. The cardiomediastinal contours are unremarkable. No acute osseous or soft tissue abnormality. IMPRESSION: Mild airways thickening with streaky and patchy basilar opacities, could reflect atelectasis given low volumes and/or developing airspace disease. Electronically Signed   By: Lovena Le M.D.   On: 07/18/2020 18:48    Procedures Procedures (including critical care time)  Medications Ordered in ED Medications  sodium chloride flush (NS) 0.9 % injection 3 mL (has no administration in time range)  diphenhydrAMINE (BENADRYL) capsule 50 mg (has no administration in time range)    Or  diphenhydrAMINE (BENADRYL) injection 50 mg (has no administration in time range)  ketorolac (TORADOL) 30 MG/ML injection 30 mg (30 mg Intravenous Given 07/19/20 0255)  methocarbamol (ROBAXIN) tablet 500 mg (500 mg Oral Given 07/19/20 0255)  alum & mag hydroxide-simeth (MAALOX/MYLANTA) 200-200-20 MG/5ML suspension 30 mL (30 mLs Oral Given 07/19/20 0255)  ondansetron (ZOFRAN) injection 4 mg (4 mg Intravenous Given 07/19/20 0255)  hydrocortisone sodium succinate (SOLU-CORTEF) injection 200 mg (200 mg Intravenous Given 07/19/20 0400)    ED Course  I have reviewed the triage vital signs and the nursing notes.  Pertinent labs & imaging results that were available during my care of the patient were reviewed by me and considered in my medical decision making (see chart for details).    MDM Rules/Calculators/A&P                          Unclear etiology however patient with pleuritic pain, abnormal  cxr will get ct scan to ensure no PE or lung architectural issues. Lipase mildly elevated but not to the point to think it's pancreatitis, also pancreas will be partially imaged on ct scan.   Apparently patient had itching and vomiting after IV contrast at some point in past so will get pretreatment.   Care transferred pending CT.  Final Clinical Impression(s) / ED Diagnoses Final diagnoses:  None    Rx / DC Orders ED Discharge Orders    None       Jannet Calip, Corene Cornea, MD 07/21/20 0003

## 2020-07-19 NOTE — Discharge Instructions (Addendum)
Take ibuprofen 600 mg every 6 hours as needed for pain.  Follow-up with cardiology if your symptoms persist, and return to the ER if you develop worsening pain, difficulty breathing, high fever, or other new and concerning symptoms.

## 2020-08-03 ENCOUNTER — Ambulatory Visit: Payer: BC Managed Care – PPO | Admitting: Family Medicine

## 2020-08-05 ENCOUNTER — Encounter: Payer: Self-pay | Admitting: Family Medicine

## 2020-08-15 ENCOUNTER — Ambulatory Visit: Payer: BC Managed Care – PPO | Admitting: Physician Assistant

## 2021-06-07 DIAGNOSIS — R072 Precordial pain: Secondary | ICD-10-CM | POA: Insufficient documentation

## 2021-06-07 NOTE — Progress Notes (Signed)
Patient referred by [name], MD for chest pain  Subjective:   Leonard Henry, male    DOB: 1975/10/02, 46 y.o.   MRN: [id number]   Chief Complaint  Patient presents with   Chest Pain   New Patient (Initial Visit)    Referred by Dr. Willy Eddy     HPI  46 y.o. Leonard Henry male with hyperlipidemia, family history of CAD, referred for evaluation of chest pain  Patient is originally from [place], [place].  He is a Nature conservation officer, works in Program[employer], applications.  He is strong family history of coronary artery disease with father having had an MI at age 72, and mother having undergone stent as well as bypass surgery in her 34s.  Patient has had left-sided dull pain for almost a year.  He is not a very good historian and is unable to qualify or quantify the pain.  However, he denies any specific correlation with exertion.  For example, he usually does not feel his pain when he is working or occasionally walking.  Pain usually happens after long day of work, at rest. Patient was seen in ER in 06/2020.  At that time, ACS was excluded.  CT chest at that time showed no PE, but showed peripheral opacities concerning for possible COVID infection.  He is currently on no antihypertensive or lipid-lowering therapy.  His LDL is 124.  He does not smoke, drinks alcohol only occasionally.  His diet does not include red meat, he tries to avoid fatty food.  He eats fast food once or twice a week.   Past Medical History:  Diagnosis Date   Chest pain      History reviewed. No pertinent surgical history.   Social History   Tobacco Use  Smoking Status Never  Smokeless Tobacco Never    Social History   Substance and Sexual Activity  Alcohol Use Yes     Family History  Problem Relation Age of Onset   Diabetes Mother    Heart disease Mother    Hyperlipidemia Mother    Heart disease Father      No current outpatient medications on file prior to visit.   No current  facility-administered medications on file prior to visit.    Cardiovascular and other pertinent studies:   EKG 06/08/2021: Sinus rhythm 53 bpm Incomplete LBBB  CTA PE protocol 07/20/2021: 1.  No evidence of pulmonary embolism. 2. Bibasilar peripherally predominant heterogeneously enhancing opacities. Differential considerations include atelectasis versus atypical infection, including COVID-19 given peripheral predominance.    Recent labs: 06/30/2020: Glucose 128, BUN/Cr 12/0.87. EGFR >60. Na/K 138/3.3. Rest of the CMP normal H/H 14/43. MCV 81. Platelets 265 HbA1C N/A TSH N/A  02/2020: Chol 185, TG 109, HDL 41, LDL 124   Review of Systems  Cardiovascular:  Positive for chest pain. Negative for dyspnea on exertion, leg swelling, palpitations and syncope.        Vitals:   06/08/21 0852  BP: 107/75  Pulse: 60  Resp: 17  Temp: 98.2 F (36.8 C)  SpO2: 96%     Body mass index is 35.15 kg/m. Filed Weights   06/08/21 0852  Weight: 245 lb (111.1 kg)     Objective:   Physical Exam Vitals and nursing note reviewed.  Constitutional:      General: He is not in acute distress.    Appearance: He is obese.     Comments: Central obesity  Neck:     Vascular: No JVD.  Cardiovascular:     Rate and Rhythm: Normal rate and regular rhythm.     Heart sounds: Normal heart sounds. No murmur heard. Pulmonary:     Effort: Pulmonary effort is normal.     Breath sounds: Normal breath sounds. No wheezing or rales.  Musculoskeletal:     Right lower leg: No edema.     Left lower leg: No edema.         Assessment & Recommendations:   46 y.o. Leonard Henry male with hyperlipidemia, family history of CAD, referred for evaluation of chest pain  Chest pain: Atypical.  However, his risk factors for CAD, including strong family history of early CAD, mixed hyperlipidemia. Given his incomplete left bundle rash block at rest, recommend Lexiscan nuclear stress test. We will also  check calcium score scan and lipoprotein a.  Mixed hyperlipidemia: Reluctant to consider statin therapy at this time.  Discussed heart healthy diet and lifestyle, especially walking.  Based on lipoprotein a and CT cardiac scoring, will offer further recommendations.  Thank you for referring the patient to [place]. Please feel free to contact with any questions.   Nigel Mormon, MD Pager: [phone] Office: [phone]

## 2021-06-08 ENCOUNTER — Encounter: Payer: Self-pay | Admitting: Cardiology

## 2021-06-08 ENCOUNTER — Ambulatory Visit
Admission: RE | Admit: 2021-06-08 | Discharge: 2021-06-08 | Disposition: A | Discharge status: Home / Self Care | Payer: No Typology Code available for payment source | Source: Ambulatory Visit | Attending: Cardiology | Admitting: Cardiology

## 2021-06-08 ENCOUNTER — Ambulatory Visit: Payer: BC Managed Care – PPO | Admitting: Cardiology

## 2021-06-08 ENCOUNTER — Other Ambulatory Visit: Payer: Self-pay

## 2021-06-08 VITALS — BP 107/75 | HR 60 | Temp 98.2°F | Resp 17 | Ht 70.0 in | Wt 245.0 lb

## 2021-06-08 DIAGNOSIS — R072 Precordial pain: Secondary | ICD-10-CM

## 2021-06-08 DIAGNOSIS — Z8249 Family history of ischemic heart disease and other diseases of the circulatory system: Secondary | ICD-10-CM | POA: Insufficient documentation

## 2021-06-08 DIAGNOSIS — E782 Mixed hyperlipidemia: Secondary | ICD-10-CM | POA: Insufficient documentation

## 2021-06-09 LAB — LIPOPROTEIN A (LPA): Lipoprotein (a): 80.2 nmol/L — ABNORMAL HIGH (ref <75.0)

## 2021-06-14 ENCOUNTER — Other Ambulatory Visit: Payer: BC Managed Care – PPO

## 2021-07-06 ENCOUNTER — Ambulatory Visit: Payer: BC Managed Care – PPO | Admitting: Cardiology

## 2021-07-10 ENCOUNTER — Ambulatory Visit: Payer: BC Managed Care – PPO

## 2021-07-10 ENCOUNTER — Other Ambulatory Visit: Payer: Self-pay

## 2021-07-10 DIAGNOSIS — R072 Precordial pain: Secondary | ICD-10-CM

## 2021-07-17 ENCOUNTER — Other Ambulatory Visit: Payer: Self-pay

## 2021-07-17 ENCOUNTER — Ambulatory Visit: Payer: BC Managed Care – PPO | Admitting: Cardiology

## 2021-07-17 ENCOUNTER — Encounter: Payer: Self-pay | Admitting: Cardiology

## 2021-07-17 VITALS — BP 110/78 | HR 120 | Temp 97.4°F | Ht 70.0 in | Wt 249.0 lb

## 2021-07-17 DIAGNOSIS — E782 Mixed hyperlipidemia: Secondary | ICD-10-CM

## 2021-07-17 DIAGNOSIS — I209 Angina pectoris, unspecified: Secondary | ICD-10-CM

## 2021-07-17 DIAGNOSIS — R9439 Abnormal result of other cardiovascular function study: Secondary | ICD-10-CM

## 2021-07-17 MED ORDER — ASPIRIN EC 81 MG PO TBEC: 81.0000 mg | DELAYED_RELEASE_TABLET | Freq: Every day | ORAL | 3 refills | Status: DC

## 2021-07-17 MED ORDER — ROSUVASTATIN CALCIUM 20 MG PO TABS: 20.0000 mg | ORAL_TABLET | Freq: Every day | ORAL | 3 refills | Status: DC

## 2021-07-17 MED ORDER — METOPROLOL SUCCINATE ER 25 MG PO TB24: 25.0000 mg | ORAL_TABLET | Freq: Every day | ORAL | 3 refills | Status: DC

## 2021-07-17 MED ORDER — NITROGLYCERIN 0.4 MG SL SUBL: 0.4000 mg | SUBLINGUAL_TABLET | SUBLINGUAL | 3 refills | Status: DC | PRN

## 2021-07-17 NOTE — Progress Notes (Signed)
Patient referred by [name], MD for chest pain  Subjective:   Leonard Henry, male    DOB: [birth year], 46 y.o.   MRN: [id number]   Chief Complaint  Patient presents with   Chest Pain   Follow-up   Results   Tachycardia     HPI  46 y.o. [name] male with hyperlipidemia, family history of CAD, referred for evaluation of chest pain  Patient continues to have occasional episodes of left-sided chest and shoulder pain, worse with or without activity.  Discussed recent test results with the patient, details below.  Initial consultation HPI 05/2021: Patient is originally from [place], [place].  He is a Nature conservation officer, works in Programmer, applications.  He is strong family history of coronary artery disease with father having had an MI at age 80, and mother having undergone stent as well as bypass surgery in her 50s.  Patient has had left-sided dull pain for almost a year.  He is not a very good historian and is unable to qualify or quantify the pain.  However, he denies any specific correlation with exertion.  For example, he usually does not feel his pain when he is working or occasionally walking.  Pain usually happens after long day of work, at rest. Patient was seen in ER in 06/2020.  At that time, ACS was excluded.  CT chest at that time showed no PE, but showed peripheral opacities concerning for possible COVID infection.  He is currently on no antihypertensive or lipid-lowering therapy.  His LDL is 124.  He does not smoke, drinks alcohol only occasionally.  His diet does not include red meat, he tries to avoid fatty food.  He eats fast food once or twice a week.   Past Medical History:  Diagnosis Date   Chest pain      History reviewed. No pertinent surgical history.   Social History   Tobacco Use  Smoking Status Never  Smokeless Tobacco Never    Social History   Substance and Sexual Activity  Alcohol Use Yes   Comment: SOCIAL     Family History   Problem Relation Age of Onset   Diabetes Mother    Heart disease Mother    Hyperlipidemia Mother    Heart disease Father      No current outpatient medications on file prior to visit.   No current facility-administered medications on file prior to visit.    Cardiovascular and other pertinent studies:  EKG 07/17/2021: Sinus tachycardia 103 bpm  Low voltage in precordial leads Poor R-wave progression  Lexiscan Sestamibi Stress Test 07/10/2021: Nondiagnostic ECG stress due to pharmacologic stress. There is a prominent diaphragmatic attenuation artifact noted in the inferior wall.  Superimposed on this there is a reversible mild degree, large defect in the lateral, inferior and septal regions. Overall LV systolic function is normal without regional wall motion abnormalities. Stress LV EF: 71%. No previous exam available for comparison. Low risk with normal LVEF and no wall motion abnormality. Clinical correlation recommended in a patient with BMI >35.    EKG 06/08/2021: Sinus rhythm 53 bpm Incomplete LBBB  CT cardiac scoring 06/08/2021: LM: 0 LAD: 3.3 LCx: 0 RCA: 16.7 Total score: 20  CTA PE protocol 07/20/2021: 1.  No evidence of pulmonary embolism. 2. Bibasilar peripherally predominant heterogeneously enhancing opacities. Differential considerations include atelectasis versus atypical infection, including COVID-19 given peripheral predominance.    Recent labs: Lipoprotein (a) 80  06/30/2020: Glucose 128, BUN/Cr 12/0.87. EGFR >60. Na/K  138/3.3. Rest of the CMP normal H/H 14/43. MCV 81. Platelets 265 HbA1C N/A TSH N/A  02/2020: Chol 185, TG 109, HDL 41, LDL 124   Review of Systems  Cardiovascular:  Positive for chest pain (No change from prior visit). Negative for dyspnea on exertion, leg swelling, palpitations and syncope.        Vitals:   07/17/21 1354 07/17/21 1407  BP: 110/78   Pulse: (!) 110 (!) 120  Temp: (!) 97.4 F (36.3 C)   SpO2: 97%      Body  mass index is 35.73 kg/m. Filed Weights   07/17/21 1354  Weight: 249 lb (112.9 kg)     Objective:   Physical Exam Vitals and nursing note reviewed.  Constitutional:      General: He is not in acute distress.    Appearance: He is obese.     Comments: Central obesity  Neck:     Vascular: No JVD.  Cardiovascular:     Rate and Rhythm: Normal rate and regular rhythm.     Heart sounds: Normal heart sounds. No murmur heard. Pulmonary:     Effort: Pulmonary effort is normal.     Breath sounds: Normal breath sounds. No wheezing or rales.  Musculoskeletal:     Right lower leg: No edema.     Left lower leg: No edema.   No change from prior visit      Assessment & Recommendations:   46 y.o. [name] male with hyperlipidemia, family history of CAD, referred for evaluation of chest pain  Chest pain: Atypical.  Stress test with a primary continuation and possible mild degree large reversible perfusion defect in lateral, inferior and septal regions.  Elevated coronary calcium score without left main calcium.  Recommend aspirin 81 mg, Crestor 20 mg, metoprolol succinate 25 mg, sublingual nitroglycerin as needed.  If symptoms not improved in 4 weeks, will consider coronary angiography and possible intervention.   Mixed hyperlipidemia: Lipid panel in 3 weeks. Lipoprotein a mildly elevated at 75.  F/u in 4 weeks   Nigel Mormon, MD Pager: [phone] Office: [phone]

## 2021-08-08 NOTE — Progress Notes (Signed)
Leonard Henry referred by [name], MD for chest pain  Subjective:   Leonard Henry, Henry    DOB: Sep 01, 1975, 46 y.o.   MRN: [id number]   Chief Complaint  Leonard Henry presents with   Precordial pain   Follow-up    4 week   Results    Lab results     HPI  Leonard Henry with hyperlipidemia, family history of CAD, referred for evaluation of chest pain  Leonard Henry is here with his wife today.  Leonard Henry has continued to have daily chest pain episodes in spite of starting metoprolol.  Leonard Henry has not required to take sublingual nitroglycerin.  Leonard Henry admits to not taking aspirin 81 mg daily.  Recent labs reviewed with the Leonard Henry, details below.  Initial consultation HPI 05/2021: Leonard Henry is originally from [place], [place].  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  Leonard Henry is strong family history of coronary artery disease with father having had an MI at age 2, and mother having undergone stent as well as bypass surgery in her 17s.  Leonard Henry has had left-sided dull pain for almost a year.  Leonard Henry is not a very good historian and is unable to qualify or quantify the pain.  However, Leonard Henry denies any specific correlation with exertion.  For example, Leonard Henry usually does not feel his pain when Leonard Henry is working or occasionally walking.  Pain usually happens after long day of work, at rest. Leonard Henry was seen in ER in 06/2020.  At that time, ACS was excluded.  CT chest at that time showed no PE, but showed peripheral opacities concerning for possible COVID infection.  Leonard Henry is currently on no antihypertensive or lipid-lowering therapy.  His LDL is 124.  Leonard Henry does not smoke, drinks alcohol only occasionally.  His diet does not include red meat, Leonard Henry tries to avoid fatty food.  Leonard Henry eats fast food once or twice a week.   Past Medical History:  Diagnosis Date   Chest pain      No past surgical history on file.   Social History   Tobacco Use  Smoking Status Never  Smokeless Tobacco Never    Social History    Substance and Sexual Activity  Alcohol Use Yes   Comment: SOCIAL     Family History  Problem Relation Age of Onset   Diabetes Mother    Heart disease Mother    Hyperlipidemia Mother    Heart disease Father      Current Outpatient Medications on File Prior to Visit  Medication Sig Dispense Refill   aspirin EC 81 MG tablet Take 1 tablet (81 mg total) by mouth daily. 90 tablet 3   metoprolol succinate (TOPROL-XL) 25 MG 24 hr tablet Take 1 tablet (25 mg total) by mouth daily. Take with or immediately following a meal. 30 tablet 3   nitroGLYCERIN (NITROSTAT) 0.4 MG SL tablet Place 1 tablet (0.4 mg total) under the tongue every 5 (five) minutes as needed for chest pain. 30 tablet 3   rosuvastatin (CRESTOR) Leonard MG tablet Take 1 tablet (Leonard mg total) by mouth daily. 30 tablet 3   No current facility-administered medications on file prior to visit.    Cardiovascular and other pertinent studies:  EKG 07/17/2021: Sinus tachycardia 103 bpm  Low voltage in precordial leads Poor R-wave progression  Lexiscan Sestamibi Stress Test 07/10/2021: Nondiagnostic ECG stress due to pharmacologic stress. There is a prominent diaphragmatic attenuation artifact noted in the inferior wall.  Superimposed on this  there is a reversible mild degree, large defect in the lateral, inferior and septal regions. Overall LV systolic function is normal without regional wall motion abnormalities. Stress LV EF: 71%. No previous exam available for comparison. Low risk with normal LVEF and no wall motion abnormality. Clinical correlation recommended in a Leonard Henry with BMI >35.    EKG 06/08/2021: Sinus rhythm 53 bpm Incomplete LBBB  CT cardiac scoring 06/08/2021: LM: 0 LAD: 3.3 LCx: 0 RCA: 16.7 Total score: Leonard  CTA PE protocol 07/20/2021: 1.  No evidence of pulmonary embolism. 2. Bibasilar peripherally predominant heterogeneously enhancing opacities. Differential considerations include atelectasis versus atypical  infection, including COVID-19 given peripheral predominance.    Recent labs: 08/08/2021: Glucose 110, BUN/Cr 10/0.83. EGFR 110. Na/K 140/4.4.  H/H 15/42. MCV 82. Platelets 258 Chol 123, TG 166, HDL 36, LDL 59 Lipoprotein (a) 80  06/30/2020: Glucose 128, BUN/Cr 12/0.87. EGFR >60. Na/K 138/3.3. Rest of the CMP normal H/H 14/43. MCV 81. Platelets 265 HbA1C N/A TSH N/A  02/2020: Chol 185, TG 109, HDL 41, LDL 124   Review of Systems  Cardiovascular:  Positive for chest pain (No change from prior visit). Negative for dyspnea on exertion, leg swelling, palpitations and syncope.        Vitals:   08/09/21 1117  BP: 116/78  Pulse: 80  Resp: 16  Temp: 98.7 F (37.1 C)  SpO2: 98%     Body mass index is 34.87 kg/m. Filed Weights   08/09/21 1117  Weight: 243 lb (110.2 kg)     Objective:   Physical Exam Vitals and nursing note reviewed.  Constitutional:      General: Leonard Henry is not in acute distress.    Appearance: Leonard Henry is obese.     Comments: Central obesity  Neck:     Vascular: No JVD.  Cardiovascular:     Rate and Rhythm: Normal rate and regular rhythm.     Heart sounds: Normal heart sounds. No murmur heard. Pulmonary:     Effort: Pulmonary effort is normal.     Breath sounds: Normal breath sounds. No wheezing or rales.  Musculoskeletal:     Right lower leg: No edema.     Left lower leg: No edema.        Assessment & Recommendations:    Leonard Henry with hyperlipidemia, family history of CAD, referred for evaluation of chest pain  Chest pain: Atypical.  However, Leonard Henry has risk factors for CAD given family history, and elevated lipoprotein a.  Stress test did show reversible ischemia.  Given his continued symptoms, recommend coronary angiography with possible intervention.  Risks benefits, alternate option discussed with the Leonard Henry in detail.  Recommend aspirin 81 mg, Crestor Leonard mg, metoprolol succinate 25 mg, sublingual nitroglycerin as needed.    Mixed hyperlipidemia: LDL down from 124 to 59 on Crestor Leonard mg daily.  Continue the same. Should Leonard Henry have obstructive CAD, would consider adding Repatha given his elevated lipoprotein a.  F/u after cath   Nigel Mormon, MD Pager: [phone] Office: [phone]

## 2021-08-09 ENCOUNTER — Ambulatory Visit: Payer: BC Managed Care – PPO | Admitting: Cardiology

## 2021-08-09 ENCOUNTER — Encounter: Payer: Self-pay | Admitting: Cardiology

## 2021-08-09 ENCOUNTER — Other Ambulatory Visit: Payer: Self-pay

## 2021-08-09 VITALS — BP 116/78 | HR 80 | Temp 98.7°F | Resp 16 | Ht 70.0 in | Wt 243.0 lb

## 2021-08-09 DIAGNOSIS — I209 Angina pectoris, unspecified: Secondary | ICD-10-CM

## 2021-08-09 DIAGNOSIS — E782 Mixed hyperlipidemia: Secondary | ICD-10-CM

## 2021-08-09 DIAGNOSIS — R9439 Abnormal result of other cardiovascular function study: Secondary | ICD-10-CM

## 2021-08-09 LAB — CBC
Hematocrit: 43.4 % (ref 37.5–51.0)
Hemoglobin: 15.1 g/dL (ref 13.0–17.7)
MCH: 28.7 pg (ref 26.6–33.0)
MCHC: 34.8 g/dL (ref 31.5–35.7)
MCV: 82 fL (ref 79–97)
Platelets: 258 10*3/uL (ref 150–450)
RBC: 5.27 x10E6/uL (ref 4.14–5.80)
RDW: 13.3 % (ref 11.6–15.4)
WBC: 7.9 10*3/uL (ref 3.4–10.8)

## 2021-08-09 LAB — BASIC METABOLIC PANEL
BUN/Creatinine Ratio: 12 (ref 9–20)
BUN: 10 mg/dL (ref 6–24)
CO2: 19 mmol/L — ABNORMAL LOW (ref 20–29)
Calcium: 9.5 mg/dL (ref 8.7–10.2)
Chloride: 106 mmol/L (ref 96–106)
Creatinine, Ser: 0.83 mg/dL (ref 0.76–1.27)
Glucose: 110 mg/dL — ABNORMAL HIGH (ref 65–99)
Potassium: 4.4 mmol/L (ref 3.5–5.2)
Sodium: 140 mmol/L (ref 134–144)
eGFR: 110 mL/min/{1.73_m2} (ref >59)

## 2021-08-09 LAB — LIPID PANEL
Chol/HDL Ratio: 3.4 ratio (ref 0.0–5.0)
Cholesterol, Total: 123 mg/dL (ref 100–199)
HDL: 36 mg/dL — ABNORMAL LOW (ref >39)
LDL Chol Calc (NIH): 59 mg/dL (ref 0–99)
Triglycerides: 166 mg/dL — ABNORMAL HIGH (ref 0–149)
VLDL Cholesterol Cal: 28 mg/dL (ref 5–40)

## 2021-08-14 ENCOUNTER — Ambulatory Visit: Payer: BC Managed Care – PPO | Admitting: Cardiology

## 2021-08-18 ENCOUNTER — Ambulatory Visit: Payer: BC Managed Care – PPO | Admitting: Family Medicine

## 2021-08-22 ENCOUNTER — Other Ambulatory Visit: Payer: Self-pay

## 2021-08-22 ENCOUNTER — Ambulatory Visit (HOSPITAL_COMMUNITY)
Admission: RE | Admit: 2021-08-22 | Discharge: 2021-08-22 | Disposition: A | Discharge status: Home / Self Care | Payer: BC Managed Care – PPO | Attending: Cardiology | Admitting: Cardiology

## 2021-08-22 ENCOUNTER — Encounter (HOSPITAL_COMMUNITY)
Admission: RE | Disposition: A | Discharge status: Home / Self Care | Payer: Self-pay | Source: Home / Self Care | Attending: Cardiology

## 2021-08-22 DIAGNOSIS — R9439 Abnormal result of other cardiovascular function study: Secondary | ICD-10-CM | POA: Diagnosis present

## 2021-08-22 DIAGNOSIS — E782 Mixed hyperlipidemia: Secondary | ICD-10-CM | POA: Diagnosis not present

## 2021-08-22 DIAGNOSIS — Z7982 Long term (current) use of aspirin: Secondary | ICD-10-CM | POA: Diagnosis not present

## 2021-08-22 DIAGNOSIS — I209 Angina pectoris, unspecified: Secondary | ICD-10-CM | POA: Diagnosis present

## 2021-08-22 DIAGNOSIS — I25119 Atherosclerotic heart disease of native coronary artery with unspecified angina pectoris: Secondary | ICD-10-CM | POA: Insufficient documentation

## 2021-08-22 DIAGNOSIS — Z79899 Other long term (current) drug therapy: Secondary | ICD-10-CM | POA: Diagnosis not present

## 2021-08-22 DIAGNOSIS — Z8249 Family history of ischemic heart disease and other diseases of the circulatory system: Secondary | ICD-10-CM | POA: Insufficient documentation

## 2021-08-22 HISTORY — PX: LEFT HEART CATH AND CORONARY ANGIOGRAPHY: CATH118249

## 2021-08-22 SURGERY — LEFT HEART CATH AND CORONARY ANGIOGRAPHY
Anesthesia: LOCAL

## 2021-08-22 MED ORDER — FAMOTIDINE IN NACL 20-0.9 MG/50ML-% IV SOLN
INTRAVENOUS | Status: AC
  Filled 2021-08-22: qty 50

## 2021-08-22 MED ORDER — HEPARIN SODIUM (PORCINE) 1000 UNIT/ML IJ SOLN
INTRAMUSCULAR | Status: AC
  Filled 2021-08-22: qty 1

## 2021-08-22 MED ORDER — NITROGLYCERIN 1 MG/10 ML FOR IR/CATH LAB
INTRA_ARTERIAL | Status: DC | PRN
  Administered 2021-08-22 (×3): 100 ug

## 2021-08-22 MED ORDER — SODIUM CHLORIDE 0.9 % WEIGHT BASED INFUSION
1.0000 mL/kg/h | INTRAVENOUS | Status: DC
  Administered 2021-08-22 (×2): 250 mL via INTRAVENOUS

## 2021-08-22 MED ORDER — METHYLPREDNISOLONE SODIUM SUCC 125 MG IJ SOLR
INTRAMUSCULAR | Status: DC | PRN
  Administered 2021-08-22: 125 mg via INTRAVENOUS

## 2021-08-22 MED ORDER — VERAPAMIL HCL 2.5 MG/ML IV SOLN
INTRAVENOUS | Status: DC | PRN
  Administered 2021-08-22: 10 mL via INTRA_ARTERIAL

## 2021-08-22 MED ORDER — SODIUM CHLORIDE 0.9 % IV SOLN: INTRAVENOUS | Status: DC

## 2021-08-22 MED ORDER — METHYLPREDNISOLONE SODIUM SUCC 125 MG IJ SOLR
INTRAMUSCULAR | Status: AC
  Filled 2021-08-22: qty 2

## 2021-08-22 MED ORDER — IOHEXOL 350 MG/ML SOLN
INTRAVENOUS | Status: DC | PRN
  Administered 2021-08-22: 80 mL

## 2021-08-22 MED ORDER — HYDRALAZINE HCL 20 MG/ML IJ SOLN: 10.0000 mg | INTRAMUSCULAR | Status: DC | PRN

## 2021-08-22 MED ORDER — LIDOCAINE HCL (PF) 1 % IJ SOLN
INTRAMUSCULAR | Status: AC
  Filled 2021-08-22: qty 30

## 2021-08-22 MED ORDER — DIPHENHYDRAMINE HCL 50 MG/ML IJ SOLN
INTRAMUSCULAR | Status: AC
  Filled 2021-08-22: qty 1

## 2021-08-22 MED ORDER — FAMOTIDINE IN NACL 20-0.9 MG/50ML-% IV SOLN
INTRAVENOUS | Status: AC | PRN
  Administered 2021-08-22: 20 mg via INTRAVENOUS

## 2021-08-22 MED ORDER — ACETAMINOPHEN 325 MG PO TABS: 650.0000 mg | ORAL_TABLET | ORAL | Status: DC | PRN

## 2021-08-22 MED ORDER — HEPARIN (PORCINE) IN NACL 1000-0.9 UT/500ML-% IV SOLN
INTRAVENOUS | Status: DC | PRN
  Administered 2021-08-22 (×2): 500 mL

## 2021-08-22 MED ORDER — ONDANSETRON HCL 4 MG/2ML IJ SOLN: 4.0000 mg | Freq: Four times a day (QID) | INTRAMUSCULAR | Status: DC | PRN

## 2021-08-22 MED ORDER — SODIUM CHLORIDE 0.9% FLUSH: 3.0000 mL | Freq: Two times a day (BID) | INTRAVENOUS | Status: DC

## 2021-08-22 MED ORDER — HEPARIN SODIUM (PORCINE) 1000 UNIT/ML IJ SOLN
INTRAMUSCULAR | Status: DC | PRN
  Administered 2021-08-22: 6000 [IU] via INTRAVENOUS

## 2021-08-22 MED ORDER — SODIUM CHLORIDE 0.9% FLUSH: 3.0000 mL | INTRAVENOUS | Status: DC | PRN

## 2021-08-22 MED ORDER — DIPHENHYDRAMINE HCL 50 MG/ML IJ SOLN
INTRAMUSCULAR | Status: DC | PRN
  Administered 2021-08-22: 25 mg via INTRAVENOUS

## 2021-08-22 MED ORDER — SODIUM CHLORIDE 0.9 % IV SOLN: 250.0000 mL | INTRAVENOUS | Status: DC | PRN

## 2021-08-22 MED ORDER — ASPIRIN 81 MG PO CHEW
81.0000 mg | CHEWABLE_TABLET | ORAL | Status: AC
Start: 2021-08-23 — End: 2021-08-22
  Administered 2021-08-22: 81 mg via ORAL
  Filled 2021-08-22: qty 1

## 2021-08-22 MED ORDER — NITROGLYCERIN 1 MG/10 ML FOR IR/CATH LAB
INTRA_ARTERIAL | Status: AC
  Filled 2021-08-22: qty 10

## 2021-08-22 MED ORDER — MIDAZOLAM HCL 2 MG/2ML IJ SOLN
INTRAMUSCULAR | Status: DC | PRN
  Administered 2021-08-22: 1 mg via INTRAVENOUS

## 2021-08-22 MED ORDER — SODIUM CHLORIDE 0.9 % WEIGHT BASED INFUSION
3.0000 mL/kg/h | INTRAVENOUS | Status: AC
  Administered 2021-08-22: 3 mL/kg/h via INTRAVENOUS

## 2021-08-22 MED ORDER — VERAPAMIL HCL 2.5 MG/ML IV SOLN
INTRAVENOUS | Status: AC
  Filled 2021-08-22: qty 2

## 2021-08-22 MED ORDER — HEPARIN (PORCINE) IN NACL 1000-0.9 UT/500ML-% IV SOLN
INTRAVENOUS | Status: AC
  Filled 2021-08-22: qty 1000

## 2021-08-22 MED ORDER — MIDAZOLAM HCL 2 MG/2ML IJ SOLN
INTRAMUSCULAR | Status: AC
  Filled 2021-08-22: qty 2

## 2021-08-22 MED ORDER — FENTANYL CITRATE (PF) 100 MCG/2ML IJ SOLN
INTRAMUSCULAR | Status: DC | PRN
  Administered 2021-08-22: 25 ug via INTRAVENOUS

## 2021-08-22 MED ORDER — LABETALOL HCL 5 MG/ML IV SOLN: 10.0000 mg | INTRAVENOUS | Status: DC | PRN

## 2021-08-22 MED ORDER — FENTANYL CITRATE (PF) 100 MCG/2ML IJ SOLN
INTRAMUSCULAR | Status: AC
  Filled 2021-08-22: qty 2

## 2021-08-22 MED ORDER — LIDOCAINE HCL (PF) 1 % IJ SOLN
INTRAMUSCULAR | Status: DC | PRN
  Administered 2021-08-22: 2 mL via INTRADERMAL

## 2021-08-22 SURGICAL SUPPLY — 10 items
CATH OPTITORQUE TIG 4.0 5F (CATHETERS) ×2 IMPLANT
DEVICE RAD COMP TR BAND LRG (VASCULAR PRODUCTS) ×2 IMPLANT
GLIDESHEATH SLEND A-KIT 6F 22G (SHEATH) ×2 IMPLANT
GUIDEWIRE INQWIRE 1.5J.035X260 (WIRE) ×1 IMPLANT
INQWIRE 1.5J .035X260CM (WIRE) ×2
KIT HEART LEFT (KITS) ×2 IMPLANT
PACK CARDIAC CATHETERIZATION (CUSTOM PROCEDURE TRAY) ×2 IMPLANT
SYR MEDRAD MARK 7 150ML (SYRINGE) ×2 IMPLANT
TRANSDUCER W/STOPCOCK (MISCELLANEOUS) ×2 IMPLANT
TUBING CIL FLEX 10 FLL-RA (TUBING) ×2 IMPLANT

## 2021-08-22 NOTE — Interval H&P Note (Signed)
History and Physical Interval Note:  08/22/2021 3:02 PM  Leonard Henry  has presented today for surgery, with the diagnosis of chest pain.  The various methods of treatment have been discussed with the patient and family. After consideration of risks, benefits and other options for treatment, the patient has consented to  Procedure(s): LEFT HEART CATH AND CORONARY ANGIOGRAPHY (N/A) as a surgical intervention.  The patient's history has been reviewed, patient examined, no change in status, stable for surgery.  I have reviewed the patient's chart and labs.  Questions were answered to the patient's satisfaction.    AUC app not available Intermediate risk stress test Two anti anginal gents Ongoing class III symptoms  Treon Kehl J Nazaria Riesen

## 2021-08-22 NOTE — H&P (Signed)
OV 08/09/2021 copied for documentation  Patient referred by [name], MD for chest pain  Subjective:   Leonard Henry, male    DOB: July 09, 1975, 46 y.o.   MRN: [id number]   Chief Complaint  Patient presents with   Precordial pain   Follow-up    4 week   Results    Lab results     HPI  46 y.o. Bier male with hyperlipidemia, family history of CAD, referred for evaluation of chest pain  Patient is here with his wife today.  He has continued to have daily chest pain episodes in spite of starting metoprolol.  He has not required to take sublingual nitroglycerin.  He admits to not taking aspirin 81 mg daily.  Recent labs reviewed with the patient, details below.  Initial consultation HPI 05/2021: Patient is originally from [place], [place].  He is a Nature conservation officer, works in Programmer, applications.  He is strong family history of coronary artery disease with father having had an MI at age 90, and mother having undergone stent as well as bypass surgery in her 67s.  Patient has had left-sided dull pain for almost a year.  He is not a very good historian and is unable to qualify or quantify the pain.  However, he denies any specific correlation with exertion.  For example, he usually does not feel his pain when he is working or occasionally walking.  Pain usually happens after long day of work, at rest. Patient was seen in ER in 06/2020.  At that time, ACS was excluded.  CT chest at that time showed no PE, but showed peripheral opacities concerning for possible COVID infection.  He is currently on no antihypertensive or lipid-lowering therapy.  His LDL is 124.  He does not smoke, drinks alcohol only occasionally.  His diet does not include red meat, he tries to avoid fatty food.  He eats fast food once or twice a week.   Past Medical History:  Diagnosis Date   Chest pain      No past surgical history on file.   Social History   Tobacco Use  Smoking Status Never   Smokeless Tobacco Never    Social History   Substance and Sexual Activity  Alcohol Use Yes   Comment: SOCIAL     Family History  Problem Relation Age of Onset   Diabetes Mother    Heart disease Mother    Hyperlipidemia Mother    Heart disease Father      Current Outpatient Medications on File Prior to Visit  Medication Sig Dispense Refill   aspirin EC 81 MG tablet Take 1 tablet (81 mg total) by mouth daily. 90 tablet 3   metoprolol succinate (TOPROL-XL) 25 MG 24 hr tablet Take 1 tablet (25 mg total) by mouth daily. Take with or immediately following a meal. 30 tablet 3   nitroGLYCERIN (NITROSTAT) 0.4 MG SL tablet Place 1 tablet (0.4 mg total) under the tongue every 5 (five) minutes as needed for chest pain. 30 tablet 3   rosuvastatin (CRESTOR) 20 MG tablet Take 1 tablet (20 mg total) by mouth daily. 30 tablet 3   No current facility-administered medications on file prior to visit.    Cardiovascular and other pertinent studies:  EKG 07/17/2021: Sinus tachycardia 103 bpm  Low voltage in precordial leads Poor R-wave progression  Lexiscan Sestamibi Stress Test 07/10/2021: Nondiagnostic ECG stress due to pharmacologic stress. There is a prominent diaphragmatic attenuation artifact noted in the inferior  wall.  Superimposed on this there is a reversible mild degree, large defect in the lateral, inferior and septal regions. Overall LV systolic function is normal without regional wall motion abnormalities. Stress LV EF: 71%. No previous exam available for comparison. Low risk with normal LVEF and no wall motion abnormality. Clinical correlation recommended in a patient with BMI >35.    EKG 06/08/2021: Sinus rhythm 53 bpm Incomplete LBBB  CT cardiac scoring 06/08/2021: LM: 0 LAD: 3.3 LCx: 0 RCA: 16.7 Total score: 20  CTA PE protocol 07/20/2021: 1.  No evidence of pulmonary embolism. 2. Bibasilar peripherally predominant heterogeneously enhancing opacities. Differential  considerations include atelectasis versus atypical infection, including COVID-19 given peripheral predominance.    Recent labs: 08/08/2021: Glucose 110, BUN/Cr 10/0.83. EGFR 110. Na/K 140/4.4.  H/H 15/42. MCV 82. Platelets 258 Chol 123, TG 166, HDL 36, LDL 59 Lipoprotein (a) 80  06/30/2020: Glucose 128, BUN/Cr 12/0.87. EGFR >60. Na/K 138/3.3. Rest of the CMP normal H/H 14/43. MCV 81. Platelets 265 HbA1C N/A TSH N/A  02/2020: Chol 185, TG 109, HDL 41, LDL 124   Review of Systems  Cardiovascular:  Positive for chest pain (No change from prior visit). Negative for dyspnea on exertion, leg swelling, palpitations and syncope.        Vitals:   08/09/21 1117  BP: 116/78  Pulse: 80  Resp: 16  Temp: 98.7 F (37.1 C)  SpO2: 98%     Body mass index is 34.87 kg/m. Filed Weights   08/09/21 1117  Weight: 243 lb (110.2 kg)     Objective:   Physical Exam Vitals and nursing note reviewed.  Constitutional:      General: He is not in acute distress.    Appearance: He is obese.     Comments: Central obesity  Neck:     Vascular: No JVD.  Cardiovascular:     Rate and Rhythm: Normal rate and regular rhythm.     Heart sounds: Normal heart sounds. No murmur heard. Pulmonary:     Effort: Pulmonary effort is normal.     Breath sounds: Normal breath sounds. No wheezing or rales.  Musculoskeletal:     Right lower leg: No edema.     Left lower leg: No edema.        Assessment & Recommendations:    46 y.o. [name] male with hyperlipidemia, family history of CAD, referred for evaluation of chest pain  Chest pain: Atypical.  However, he has risk factors for CAD given family history, and elevated lipoprotein a.  Stress test did show reversible ischemia.  Given his continued symptoms, recommend coronary angiography with possible intervention.  Risks benefits, alternate option discussed with the patient in detail.  Recommend aspirin 81 mg, Crestor 20 mg, metoprolol  succinate 25 mg, sublingual nitroglycerin as needed.   Mixed hyperlipidemia: LDL down from 124 to 59 on Crestor 20 mg daily.  Continue the same. Should he have obstructive CAD, would consider adding Repatha given his elevated lipoprotein a.  F/u after cath   Nigel Mormon, MD Pager: [phone] Office: [phone]

## 2021-08-23 ENCOUNTER — Encounter (HOSPITAL_COMMUNITY): Payer: Self-pay | Admitting: Cardiology

## 2021-09-20 ENCOUNTER — Ambulatory Visit: Payer: BC Managed Care – PPO | Admitting: Cardiology

## 2021-09-20 ENCOUNTER — Encounter: Payer: Self-pay | Admitting: Cardiology

## 2021-09-20 ENCOUNTER — Other Ambulatory Visit: Payer: Self-pay

## 2021-09-20 VITALS — BP 102/72 | HR 63 | Temp 98.2°F | Resp 17 | Ht 70.0 in | Wt 245.2 lb

## 2021-09-20 DIAGNOSIS — I209 Angina pectoris, unspecified: Secondary | ICD-10-CM | POA: Diagnosis not present

## 2021-09-20 DIAGNOSIS — E782 Mixed hyperlipidemia: Secondary | ICD-10-CM

## 2021-09-20 MED ORDER — DILTIAZEM HCL ER COATED BEADS 120 MG PO CP24: 120.0000 mg | ORAL_CAPSULE | Freq: Every day | ORAL | 3 refills | Status: DC

## 2021-09-20 NOTE — Progress Notes (Signed)
Patient referred by [name], MD for chest pain  Subjective:   Leonard Henry, male    DOB: 1975/08/31, 46 y.o.   MRN: [id number]   Chief Complaint  Patient presents with   Follow-up   Chest Pain     HPI  46 y.o. Leonard Henry male with hyperlipidemia, family history of CAD, angina with probable microvascular dysfunction  Chest pain is somewhat better, but not resolved. It tends to occur unrelated to exertion. He does not seem to experience much of it when he is walking upto 10,000 steps.    Initial consultation HPI 05/2021: Patient is originally from [place], [place].  He is a Nature conservation officer, works in Programmer, applications.  He is strong family history of coronary artery disease with father having had an MI at age 46, and mother having undergone stent as well as bypass surgery in her 30s.  Patient has had left-sided dull pain for almost a year.  He is not a very good historian and is unable to qualify or quantify the pain.  However, he denies any specific correlation with exertion.  For example, he usually does not feel his pain when he is working or occasionally walking.  Pain usually happens after long day of work, at rest. Patient was seen in ER in 06/2020.  At that time, ACS was excluded.  CT chest at that time showed no PE, but showed peripheral opacities concerning for possible COVID infection.  He is currently on no antihypertensive or lipid-lowering therapy.  His LDL is 124.  He does not smoke, drinks alcohol only occasionally.  His diet does not include red meat, he tries to avoid fatty food.  He eats fast food once or twice a week.    Current Outpatient Medications on File Prior to Visit  Medication Sig Dispense Refill   metoprolol succinate (TOPROL-XL) 25 MG 24 hr tablet Take 1 tablet (25 mg total) by mouth daily. Take with or immediately following a meal. 30 tablet 3   nitroGLYCERIN (NITROSTAT) 0.4 MG SL tablet Place 1 tablet (0.4 mg total) under the tongue every  5 (five) minutes as needed for chest pain. 30 tablet 3   rosuvastatin (CRESTOR) 20 MG tablet Take 1 tablet (20 mg total) by mouth daily. 30 tablet 3   No current facility-administered medications on file prior to visit.    Cardiovascular and other pertinent studies:  Coronary angiography 08/22/2021: LM: Normal LAD: Ostial 30% stenosis Lcx: Normal RCA: Large. Minimal irregularities   Slow flow s/o microvascular dysfunction could possibly be reason for his angina Okay to skip Aspirin. Continue statin. Will try calcium channel blocker.    EKG 07/17/2021: Sinus tachycardia 103 bpm  Low voltage in precordial leads Poor R-wave progression  Lexiscan Sestamibi Stress Test 07/10/2021: Nondiagnostic ECG stress due to pharmacologic stress. There is a prominent diaphragmatic attenuation artifact noted in the inferior wall.  Superimposed on this there is a reversible mild degree, large defect in the lateral, inferior and septal regions. Overall LV systolic function is normal without regional wall motion abnormalities. Stress LV EF: 71%. No previous exam available for comparison. Low risk with normal LVEF and no wall motion abnormality. Clinical correlation recommended in a patient with BMI >35.    EKG 06/08/2021: Sinus rhythm 53 bpm Incomplete LBBB  CT cardiac scoring 06/08/2021: LM: 0 LAD: 3.3 LCx: 0 RCA: 16.7 Total score: 20  CTA PE protocol 07/20/2021: 1.  No evidence of pulmonary embolism. 2. Bibasilar peripherally predominant heterogeneously enhancing  opacities. Differential considerations include atelectasis versus atypical infection, including COVID-19 given peripheral predominance.    Recent labs: 08/08/2021: Glucose 110, BUN/Cr 10/0.83. EGFR 110. Na/K 140/4.4.  H/H 15/42. MCV 82. Platelets 258 Chol 123, TG 166, HDL 36, LDL 59 Lipoprotein (a) 80  06/30/2020: Glucose 128, BUN/Cr 12/0.87. EGFR >60. Na/K 138/3.3. Rest of the CMP normal H/H 14/43. MCV 81. Platelets 265 HbA1C  N/A TSH N/A  02/2020: Chol 185, TG 109, HDL 41, LDL 124   Review of Systems  Cardiovascular:  Positive for chest pain (No change from prior visit). Negative for dyspnea on exertion, leg swelling, palpitations and syncope.        Vitals:   09/20/21 1338  BP: 102/72  Pulse: 63  Resp: 17  Temp: 98.2 F (36.8 C)  SpO2: 96%     Body mass index is 35.18 kg/m. Filed Weights   09/20/21 1338  Weight: 245 lb 3.2 oz (111.2 kg)     Objective:   Physical Exam Vitals and nursing note reviewed.  Constitutional:      General: He is not in acute distress.    Appearance: He is obese.     Comments: Central obesity  Neck:     Vascular: No JVD.  Cardiovascular:     Rate and Rhythm: Normal rate and regular rhythm.     Heart sounds: Normal heart sounds. No murmur heard. Pulmonary:     Effort: Pulmonary effort is normal.     Breath sounds: Normal breath sounds. No wheezing or rales.  Musculoskeletal:     Right lower leg: No edema.     Left lower leg: No edema.        Assessment & Recommendations:    46 y.o. Miles male with hyperlipidemia, family history of CAD, angina with probable microvascular dysfunction  Chest pain: Minimal nonbstructive CAD but slow flow in all vessels. Possible microvascular dysfunction. That said, his chest pain-unrelated to physical exertion, could be musculoskeletal. Nonetheless, I will try changing metoprolol succinate 25 mg daily to diltiazem 120 mg daily. Continue Crestor 20 mg. No aspirin given <50% disease  Mixed hyperlipidemia: Continue Crestor 20 mg daily. Repeat lipid panel in 6 months  F/u in 6 months   Nigel Mormon, MD Pager: [phone] Office: [phone]

## 2021-09-30 DIAGNOSIS — M545 Low back pain, unspecified: Secondary | ICD-10-CM | POA: Diagnosis not present

## 2021-09-30 DIAGNOSIS — G8929 Other chronic pain: Secondary | ICD-10-CM | POA: Diagnosis not present

## 2021-11-17 ENCOUNTER — Other Ambulatory Visit: Payer: Self-pay

## 2021-11-17 DIAGNOSIS — I209 Angina pectoris, unspecified: Secondary | ICD-10-CM

## 2021-11-17 MED ORDER — ROSUVASTATIN CALCIUM 20 MG PO TABS: 20.0000 mg | ORAL_TABLET | Freq: Every day | ORAL | 3 refills | Status: DC

## 2021-12-15 ENCOUNTER — Other Ambulatory Visit: Payer: Self-pay

## 2021-12-18 ENCOUNTER — Ambulatory Visit: Payer: BC Managed Care – PPO | Admitting: Family Medicine

## 2021-12-18 DIAGNOSIS — G8929 Other chronic pain: Secondary | ICD-10-CM | POA: Insufficient documentation

## 2021-12-20 ENCOUNTER — Encounter: Payer: Self-pay | Admitting: Family Medicine

## 2021-12-20 ENCOUNTER — Other Ambulatory Visit: Payer: Self-pay

## 2021-12-20 ENCOUNTER — Ambulatory Visit (INDEPENDENT_AMBULATORY_CARE_PROVIDER_SITE_OTHER): Payer: BC Managed Care – PPO | Admitting: Family Medicine

## 2021-12-20 VITALS — BP 126/74 | HR 72 | Temp 97.6°F | Ht 70.0 in | Wt 247.8 lb

## 2021-12-20 DIAGNOSIS — E669 Obesity, unspecified: Secondary | ICD-10-CM

## 2021-12-20 DIAGNOSIS — E66812 Obesity, class 2: Secondary | ICD-10-CM

## 2021-12-20 DIAGNOSIS — Z23 Encounter for immunization: Secondary | ICD-10-CM

## 2021-12-20 DIAGNOSIS — M7711 Lateral epicondylitis, right elbow: Secondary | ICD-10-CM | POA: Diagnosis not present

## 2021-12-20 DIAGNOSIS — I2510 Atherosclerotic heart disease of native coronary artery without angina pectoris: Secondary | ICD-10-CM | POA: Diagnosis not present

## 2021-12-20 DIAGNOSIS — Z1211 Encounter for screening for malignant neoplasm of colon: Secondary | ICD-10-CM

## 2021-12-20 DIAGNOSIS — M170 Bilateral primary osteoarthritis of knee: Secondary | ICD-10-CM

## 2021-12-20 DIAGNOSIS — E6609 Other obesity due to excess calories: Secondary | ICD-10-CM | POA: Insufficient documentation

## 2021-12-20 DIAGNOSIS — E782 Mixed hyperlipidemia: Secondary | ICD-10-CM

## 2021-12-20 DIAGNOSIS — E66811 Obesity, class 1: Secondary | ICD-10-CM | POA: Insufficient documentation

## 2021-12-20 DIAGNOSIS — M7712 Lateral epicondylitis, left elbow: Secondary | ICD-10-CM

## 2021-12-20 MED ORDER — DILTIAZEM HCL ER COATED BEADS 120 MG PO CP24: 120.0000 mg | ORAL_CAPSULE | Freq: Every day | ORAL | 3 refills | Status: DC

## 2021-12-20 NOTE — Progress Notes (Signed)
Leonard Henry, Leonard Henry, 46 y.o..   MRN: [id number]  Chief Complaint  Patient presents with   Establish Care    NP- establish care.  C/o having pain in the LT arm/chest pain months. He has seen cardiology in July.       History of Present Illness:  Patient is in today to establish care. Leonard Henry was born in the [place] region of [place]. He moved to the [place] in 2001. He was married in 2008. He has a 65 year-old daughter and step-daughter who is 70. He attended university in [place] and study arts. 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 He denies tobacco or drug use and only occasionally drinks alcohol.  Leonard Henry has a history of chest pain. He underwent evaluation earlier this year, having a left heart cath to evaluate his chest pain.  This showed a 30% stenosis of the LAD ostium, but was otherwise normal and did not explain his pain. His cardiologist did have him take diltiazem and rosuvastatin. he did not feel he needed an aspirin. He felt his chest pain was from a non-cardiac source.  Leonard Henry states the pain he experiences can occur with exertion or at rest, though it is sometimes worse with exertion. The pain involves the left chest wall. It is not associated with cough. He denies a history of heartburn. He has had a prior COVID infection, but has no residual symptoms. He does have a history of right lateral epicondylitis. He notes he now has similar issues on the left and feels this is worsening.[name] He has a prescription for naproxen, but only occasionally uses this.  Leonard Henry also complains of bilateral knee pain. He finds that this aches while he works.  Leonard Henry has a history of plantar fasciitis. He feels his work exacerbates this, but he is tolerating it.    Past Medical History: Patient Active Problem List   Diagnosis Date Noted   Coronary artery disease 12/20/2021   Bilateral primary osteoarthritis of knee 12/20/2021   Obesity, Class II, BMI 35-39.9 12/20/2021   Chronic pain 12/18/2021   Abnormal stress test 07/17/2021   Family history of early CAD 06/08/2021   Mixed hyperlipidemia 06/08/2021   Precordial pain 06/07/2021   Lateral epicondylitis of both elbows 02/05/2019   Plantar fasciitis, bilateral 04/22/2018   Past Surgical History:  Procedure Laterality Date   LEFT HEART CATH AND CORONARY ANGIOGRAPHY N/A 08/22/2021   Procedure: LEFT HEART CATH AND CORONARY ANGIOGRAPHY;  Surgeon: Nigel Mormon, MD;  Location: [facility];  Service: Cardiovascular;  Laterality: N/A;   Family History  Problem Relation Age of Onset   Hypertension Mother    Diabetes Mother    Heart disease Mother    Hyperlipidemia Mother    Diabetes Father    Hypertension Father    Heart disease Father    Outpatient Medications Prior to Visit  Medication Sig Dispense Refill   nitroGLYCERIN (NITROSTAT) 0.4 MG SL tablet Place 1 tablet (0.4 mg total) under the tongue every 5 (five) minutes as needed for chest pain. 30 tablet 3   rosuvastatin (CRESTOR) 20 MG tablet Take 1 tablet (20 mg total) by mouth daily. 30 tablet 3   diltiazem (CARDIZEM CD) 120 MG 24 hr capsule Take 1 capsule (  120 mg total) by mouth daily. 90 capsule 3   metoprolol succinate (TOPROL-XL) 25 MG 24 hr tablet Take 25 mg by mouth daily.     No facility-administered medications prior to visit.   Allergies  Allergen Reactions   Iopamidol Itching and Nausea And Vomiting    Objective:   Today's Vitals   12/20/21 1524  BP: 126/74  Pulse: 72  Temp: 97.6 F (36.4 C)  TempSrc: Temporal  SpO2: 98%  Weight: 247 lb 12.8 oz (112.4 kg)  Height: 5\' 10"  (1.778 m)   Body mass index is 35.56 kg/m.   General: Well developed, well nourished. No acute distress. Lungs: Clear to  auscultation bilaterally. No wheezing, rales or rhonchi. CV: RRR without murmurs or rubs. Pulses 2+ bilaterally. Chest: No chest wall tenderness. Extremities: Full ROM of shoulders, elbows and knees. No subluxation or apprehension sign of shoulder.   Rotator cuff strength is normal. Tenderness over the lateral epicondyle with palpation bilaterally. No   significant crepitance of the knees. varus/valgus testing and Lachman's are normal. There is some   popping in both knees with McMurray's maneuver. No joint swelling. Psych: Alert and oriented. Normal mood and affect.  Health Maintenance Due  Topic Date Due   COVID-19 Vaccine (1) Never done   Hepatitis C Screening  Never done   COLONOSCOPY (Pts 45-32yrs Insurance coverage will need to be confirmed)  Never done   Lab Results Last lipids Lab Results  Component Value Date   CHOL 123 08/08/2021   HDL 36 (L) 08/08/2021   LDLCALC 59 08/08/2021   TRIG 166 (H) 08/08/2021   CHOLHDL 3.4 08/08/2021   Imaging: Chest x-ray (07/18/2020) IMPRESSION: Mild airways thickening with streaky and patchy basilar opacities, could reflect atelectasis given low volumes and/or developing airspace disease.  CT Angio of Chest (07/19/2020) IMPRESSION: 1.  No evidence of pulmonary embolism.   2. Bibasilar peripherally predominant heterogeneously enhancing opacities. Differential considerations include atelectasis versus atypical infection, including COVID-19 given peripheral predominance.  CT Cardiac Scoring (06/08/2021) IMPRESSION: Coronary calcium score of 20 is at the 84th percentile for the patient's age, sex and race.  [Radiologist notes "There is some scarring in the medial right lower lobe and lingula".]    Assessment & Plan:   1. Coronary artery disease involving native coronary artery of native heart without angina pectoris Mr. Partington has some mild CAD without clear angina. He will continue on diltiazem. Cardiology did not feel an asprin was  indicated. 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  - diltiazem (CARDIZEM CD) 120 MG 24 hr capsule; Take 1 capsule (120 mg total) by mouth daily.  Dispense: 90 capsule; Refill: 3  2. Lateral epicondylitis of both elbows Exam consistent with bilateral epicondylitis. As he has not responded to more conservative therapy. I will refer him to Sport's medicine to consider a steroid injection.  - Ambulatory referral to Sports Medicine  3. Mixed hyperlipidemia Continue rosuvastatin. Due for repeat lipids in 3 months.  4. Bilateral primary osteoarthritis of knee Knee exam reveals some OA with possible meniscal degeneration. I recommend he continue to remain active and use naproxen as needed for now.  5. Obesity, Class II, BMI 35-39.9 Weight loss would help to improve both his arthritis issues and reduce risk for progression of CAD. Mr. Grismer was interested in referral for weight management.  - Amb Ref to Medical Weight Management  6. Screening for colon cancer  - Ambulatory referral to  Gastroenterology  7. Need for influenza vaccination  - Flu Vaccine QUAD 6+ mos PF IM (Fluarix Quad PF)  Haydee Salter, MD

## 2021-12-26 ENCOUNTER — Ambulatory Visit: Payer: BC Managed Care – PPO | Admitting: Family Medicine

## 2021-12-26 NOTE — Progress Notes (Deleted)
° °  I, Peterson Lombard, LAT, ATC acting as a scribe for Lynne Leader, MD.  Subjective:    CC: Bilat elbow pain  HPI: Pt is a 46 y/o male c/o bilat elbow pain x /. Pt locates pain to  Radiates:  Grip strength: UE Numbness/tingling: Aggravates: Treatments tried:  Pertinent review of Systems: ***  Relevant historical information: ***   Objective:   There were no vitals filed for this visit. General: Well Developed, well nourished, and in no acute distress.   MSK: ***  Lab and Radiology Results No results found for this or any previous visit (from the past 72 hour(s)). No results found.    Impression and Recommendations:    Assessment and Plan: 46 y.o. male with ***.  PDMP not reviewed this encounter. No orders of the defined types were placed in this encounter.  No orders of the defined types were placed in this encounter.   Discussed warning signs or symptoms. Please see discharge instructions. Patient expresses understanding.   ***

## 2022-02-22 ENCOUNTER — Telehealth (INDEPENDENT_AMBULATORY_CARE_PROVIDER_SITE_OTHER): Payer: BC Managed Care – PPO | Admitting: Nurse Practitioner

## 2022-02-22 ENCOUNTER — Encounter: Payer: Self-pay | Admitting: Nurse Practitioner

## 2022-02-22 DIAGNOSIS — J069 Acute upper respiratory infection, unspecified: Secondary | ICD-10-CM | POA: Diagnosis not present

## 2022-02-22 DIAGNOSIS — J020 Streptococcal pharyngitis: Secondary | ICD-10-CM

## 2022-02-22 LAB — POCT INFLUENZA A/B
Influenza A, POC: NEGATIVE
Influenza B, POC: NEGATIVE

## 2022-02-22 LAB — POC COVID19 BINAXNOW: SARS Coronavirus 2 Ag: NEGATIVE

## 2022-02-22 LAB — POCT RAPID STREP A (OFFICE): Rapid Strep A Screen: POSITIVE — AB

## 2022-02-22 MED ORDER — AMOXICILLIN 500 MG PO CAPS: 500.0000 mg | ORAL_CAPSULE | Freq: Two times a day (BID) | ORAL | 0 refills | Status: AC

## 2022-02-22 MED ORDER — BENZONATATE 100 MG PO CAPS: 100.0000 mg | ORAL_CAPSULE | Freq: Three times a day (TID) | ORAL | 0 refills | Status: DC | PRN

## 2022-02-22 NOTE — Patient Instructions (Signed)
It was great to see you!  Start amoxicillin 1 tablet twice a day. You can continue mucinex and tylenol as needed. Drink plenty of fluids and get rest.  Let's follow-up if your symptoms don't improve or worsen.   If a referral was placed today, you will be contacted for an appointment. Please note that routine referrals can sometimes take up to 3-4 weeks to process. Please call our office if you haven't heard anything after this time frame.  Take care,  Vance Peper, NP

## 2022-02-22 NOTE — Progress Notes (Addendum)
[facility] [address] [place] [address] Dept: [phone] Dept Fax: [phone]  Virtual Video Visit  I connected with [name] on 02/23/22 at  4:00 PM EST by a video enabled telemedicine application and verified that I am speaking with the correct person using two identifiers.  Location patient: Home Location provider: Clinic Persons participating in the virtual visit: Patient, Provider, CMA  I discussed the limitations of evaluation and management by telemedicine and the availability of in person appointments. The patient expressed understanding and agreed to proceed.  Chief Complaint  Patient presents with   Sore Throat    Pt c/o sore throat w/ fever, cough, and headache and body aches  x2-4 days.     SUBJECTIVE:  HPI: Leonard Henry is a 47 y.o. male who presents with fever, sore throat, and headache for 3-4 days. He does not have a covid-19 test at home.   UPPER RESPIRATORY TRACT INFECTION  Fever: yes Cough: yes Shortness of breath: no Wheezing: no Chest pain: no Chest tightness: no Chest congestion: no Nasal congestion: yes Runny nose: no Post nasal drip: yes Sneezing: yes Sore throat: yes Swollen glands: yes Sinus pressure: no Headache: yes Face pain: no Toothache: no Ear pain: no bilateral Ear pressure: no bilateral Eyes red/itching:yes Eye drainage/crusting: no  Vomiting: no Rash: no Fatigue: yes Body aches: yes Sick contacts: yes - wife and daughter Strep contacts: yes - daughter Context: stable Recurrent sinusitis: no Relief with OTC cold/cough medications: no  Treatments attempted: mucinex, tylenol    Patient Active Problem List   Diagnosis Date Noted   Coronary artery disease 12/20/2021   Bilateral primary osteoarthritis of knee 12/20/2021   Obesity, Class II, BMI 35-39.9 12/20/2021   Chronic pain 12/18/2021   Abnormal stress test 07/17/2021   Family history of early CAD  06/08/2021   Mixed hyperlipidemia 06/08/2021   Precordial pain 06/07/2021   Lateral epicondylitis of both elbows 02/05/2019   Plantar fasciitis, bilateral 04/22/2018    Past Surgical History:  Procedure Laterality Date   LEFT HEART CATH AND CORONARY ANGIOGRAPHY N/A 08/22/2021   Procedure: LEFT HEART CATH AND CORONARY ANGIOGRAPHY;  Surgeon: Nigel Mormon, MD;  Location: [facility];  Service: Cardiovascular;  Laterality: N/A;    Family History  Problem Relation Age of Onset   Hypertension Mother    Diabetes Mother    Heart disease Mother    Hyperlipidemia Mother    Diabetes Father    Hypertension Father    Heart disease Father     Social History   Tobacco Use   Smoking status: Never   Smokeless tobacco: Never  Vaping Use   Vaping Use: Never used  Substance Use Topics   Alcohol use: Yes    Comment: SOCIAL   Drug use: No     Current Outpatient Medications:    amoxicillin (AMOXIL) 500 MG capsule, Take 1 capsule (500 mg total) by mouth 2 (two) times daily for 10 days., Disp: 20 capsule, Rfl: 0   benzonatate (TESSALON) 100 MG capsule, Take 1 capsule (100 mg total) by mouth 3 (three) times daily as needed for cough., Disp: 30 capsule, Rfl: 0   diltiazem (CARDIZEM CD) 120 MG 24 hr capsule, Take 1 capsule (120 mg total) by mouth daily., Disp: 90 capsule, Rfl: 3   nitroGLYCERIN (NITROSTAT) 0.4 MG SL tablet, Place 1 tablet (0.4 mg total) under the tongue every 5 (five) minutes as needed for chest pain., Disp: 30  tablet, Rfl: 3   rosuvastatin (CRESTOR) 20 MG tablet, Take 1 tablet (20 mg total) by mouth daily., Disp: 30 tablet, Rfl: 3  Allergies  Allergen Reactions   Iopamidol Itching and Nausea And Vomiting    ROS: See pertinent positives and negatives per HPI.  OBSERVATIONS/OBJECTIVE:  VITALS per patient if applicable: There were no vitals filed for this visit. There is no height or weight on file to calculate BMI.    GENERAL: Alert and oriented. Appears  well and in no acute distress.  HEENT: Atraumatic. Conjunctiva clear. No obvious abnormalities on inspection of external nose and ears.  NECK: Normal movements of the head and neck.  LUNGS: On inspection, no signs of respiratory distress. Breathing rate appears normal. No obvious gross SOB, gasping or wheezing, and no conversational dyspnea.  CV: No obvious cyanosis.  MS: Moves all visible extremities without noticeable abnormality.  PSYCH/NEURO: Pleasant and cooperative. No obvious depression or anxiety. Speech and thought processing grossly intact.  ASSESSMENT AND PLAN:  URI  Flu and covid-19 test negative. Strep throat positive. See plan below. Could have viral infection along with strep throat. Will send in tessalon prn cough. Encourage fluids and rest. Can continue mucinex and tylenol. Follow up if symptoms don't improve or worsen.   STREP PHARYNGITIS  Positive rapid strep test in office. Will treat with amoxicillin 500mg  BID x 10 days. Can take tylenol as needed for pain. Encourage fluids. Follow up if symptoms don't improve or worsen.    I discussed the assessment and treatment plan with the patient. The patient was provided an opportunity to ask questions and all were answered. The patient agreed with the plan and demonstrated an understanding of the instructions.   The patient was advised to call back or seek an in-person evaluation if the symptoms worsen or if the condition fails to improve as anticipated.  Time spent on call:  10 minutes with patient face to face via video conference. More than 50% of this time was spent in counseling and coordination of care. 5 minutes total spent in review of patient's record and preparation of their chart.   Charyl Dancer, NP

## 2022-02-23 ENCOUNTER — Telehealth: Payer: BC Managed Care – PPO | Admitting: Family Medicine

## 2022-03-19 ENCOUNTER — Other Ambulatory Visit: Payer: Self-pay

## 2022-03-20 ENCOUNTER — Other Ambulatory Visit: Payer: Self-pay | Admitting: Cardiology

## 2022-03-20 ENCOUNTER — Ambulatory Visit: Payer: BC Managed Care – PPO | Admitting: Family Medicine

## 2022-03-20 DIAGNOSIS — I209 Angina pectoris, unspecified: Secondary | ICD-10-CM

## 2022-03-21 ENCOUNTER — Encounter: Payer: Self-pay | Admitting: Family Medicine

## 2022-03-21 ENCOUNTER — Other Ambulatory Visit: Payer: Self-pay

## 2022-03-21 ENCOUNTER — Encounter: Payer: Self-pay | Admitting: Cardiology

## 2022-03-21 ENCOUNTER — Ambulatory Visit: Payer: BC Managed Care – PPO | Admitting: Cardiology

## 2022-03-21 ENCOUNTER — Ambulatory Visit: Payer: BC Managed Care – PPO | Admitting: Family Medicine

## 2022-03-21 VITALS — BP 124/80 | HR 64 | Temp 97.6°F | Ht 70.0 in | Wt 240.2 lb

## 2022-03-21 VITALS — BP 120/81 | HR 59 | Temp 98.0°F | Resp 16 | Ht 70.0 in | Wt 241.0 lb

## 2022-03-21 DIAGNOSIS — I2510 Atherosclerotic heart disease of native coronary artery without angina pectoris: Secondary | ICD-10-CM | POA: Diagnosis not present

## 2022-03-21 DIAGNOSIS — J40 Bronchitis, not specified as acute or chronic: Secondary | ICD-10-CM

## 2022-03-21 DIAGNOSIS — E782 Mixed hyperlipidemia: Secondary | ICD-10-CM

## 2022-03-21 LAB — LIPID PANEL
Cholesterol: 107 mg/dL (ref 0–200)
HDL: 36.9 mg/dL — ABNORMAL LOW (ref >39.00)
LDL Cholesterol: 54 mg/dL (ref 0–99)
NonHDL: 70.53
Total CHOL/HDL Ratio: 3
Triglycerides: 84 mg/dL (ref 0.0–149.0)
VLDL: 16.8 mg/dL (ref 0.0–40.0)

## 2022-03-21 MED ORDER — DILTIAZEM HCL ER COATED BEADS 120 MG PO CP24: 120.0000 mg | ORAL_CAPSULE | Freq: Every day | ORAL | 3 refills | Status: DC

## 2022-03-21 MED ORDER — ROSUVASTATIN CALCIUM 20 MG PO TABS: 20.0000 mg | ORAL_TABLET | Freq: Every day | ORAL | 3 refills | Status: DC

## 2022-03-21 NOTE — Progress Notes (Signed)
? ? ?Patient referred by [name], MD for chest pain ? ?Subjective:  ? ?Leonard Henry, male    DOB: 1975-06-25, 47 y.o.   MRN: [id number] ? ? ?Chief Complaint  ?Patient presents with  ? Chest Pain  ? ? ? ?HPI ? ?47 y.o. [place] male with hyperlipidemia, family history of CAD, angina with probable microvascular dysfunction ? ?Patient is doing well. He has not had any exertional chest pain. He has occasional left lower rib area pain with certain movements.  ? ? ?Initial consultation HPI 05/2021: ?Patient is originally from [place].  He is a Nature conservation officer, works in Programmer, applications.  He is strong family history of coronary artery disease with father having had an MI at age 18, and mother having undergone stent as well as bypass surgery in her 58s.  Patient has had left-sided dull pain for almost a year.  He is not a very good historian and is unable to qualify or quantify the pain.  However, he denies any specific correlation with exertion.  For example, he usually does not feel his pain when he is working or occasionally walking.  Pain usually happens after long day of work, at rest. Patient was seen in ER in 06/2020.  At that time, ACS was excluded.  CT chest at that time showed no PE, but showed peripheral opacities concerning for possible COVID infection. ? ?He is currently on no antihypertensive or lipid-lowering therapy.  His LDL is 124. ? ?He does not smoke, drinks alcohol only occasionally.  His diet does not include red meat, he tries to avoid fatty food.  He eats fast food once or twice a week. ? ? ? ?Current Outpatient Medications on File Prior to Visit  ?Medication Sig Dispense Refill  ? diltiazem (CARDIZEM CD) 120 MG 24 hr capsule Take 1 capsule (120 mg total) by mouth daily. 90 capsule 3  ? nitroGLYCERIN (NITROSTAT) 0.4 MG SL tablet Place 1 tablet (0.4 mg total) under the tongue every 5 (five) minutes as needed for chest pain. 30 tablet 3  ? rosuvastatin (CRESTOR) 20 MG tablet Take  1 tablet by mouth once daily 30 tablet 0  ? ?No current facility-administered medications on file prior to visit.  ? ? ?Cardiovascular and other pertinent studies: ? ?EKG 03/21/2022: ?Sinus rhythm 62 bpm  ?Low voltage in precordial leads ?Poor R-wave progression ?Consider old anterior infarct  ?Nonspecific T-abnormality ? ?Coronary angiography 08/22/2021: ?LM: Normal ?LAD: Ostial 30% stenosis ?Lcx: Normal ?RCA: Large. Minimal irregularities ?  ?Slow flow s/o microvascular dysfunction could possibly be reason for his angina ?Okay to skip Aspirin. Continue statin. Will try calcium channel blocker. ?  ?Lexiscan Sestamibi Stress Test 07/10/2021: ?Nondiagnostic ECG stress due to pharmacologic stress. ?There is a prominent diaphragmatic attenuation artifact noted in the inferior wall.  Superimposed on this there is a reversible mild degree, large defect in the lateral, inferior and septal regions. ?Overall LV systolic function is normal without regional wall motion abnormalities. Stress LV EF: 71%. ?No previous exam available for comparison. Low risk with normal LVEF and no wall motion abnormality. Clinical correlation recommended in a patient with BMI >35.   ? ?EKG 06/08/2021: ?Sinus rhythm 53 bpm ?Incomplete LBBB ? ?CT cardiac scoring 06/08/2021: ?LM: 0 ?LAD: 3.3 ?LCx: 0 ?RCA: 16.7 ?Total score: 20 ? ?CTA PE protocol 07/20/2021: ?1.  No evidence of pulmonary embolism. ?2. Bibasilar peripherally predominant heterogeneously enhancing ?opacities. Differential considerations include atelectasis versus ?atypical infection, including COVID-19 given peripheral ?predominance. ?  ? ?  Recent labs: ?08/08/2021: ?Glucose 110, BUN/Cr 10/0.83. EGFR 110. Na/K 140/4.4.  ?H/H 15/42. MCV 82. Platelets 258 ?Chol 123, TG 166, HDL 36, LDL 59 ?Lipoprotein (a) 80 ? ?06/30/2020: ?Glucose 128, BUN/Cr 12/0.87. EGFR >60. Na/K 138/3.3. Rest of the CMP normal ?H/H 14/43. MCV 81. Platelets 265 ?HbA1C N/A ?TSH N/A ? ?02/2020: ?Chol 185, TG 109, HDL 41, LDL  124 ? ? ?Review of Systems  ?Cardiovascular:  Positive for chest pain (No change from prior visit). Negative for dyspnea on exertion, leg swelling, palpitations and syncope.  ? ?   ? ? ?Vitals:  ? 03/21/22 1258  ?BP: 120/81  ?Pulse: (!) 59  ?Resp: 16  ?Temp: 98 ?F (36.7 ?C)  ?SpO2: 98%  ? ? ? ?Body mass index is 34.58 kg/m?. [name] Weights  ? 03/21/22 1258  ?Weight: 241 lb (109.3 kg)  ? ? ? ?Objective:  ? Physical Exam ?Vitals and nursing note reviewed.  ?Constitutional:   ?   General: He is not in acute distress. ?   Appearance: He is obese.  ?   Comments: Central obesity  ?Neck:  ?   Vascular: No JVD.  ?Cardiovascular:  ?   Rate and Rhythm: Normal rate and regular rhythm.  ?   Heart sounds: Normal heart sounds. No murmur heard. ?Pulmonary:  ?   Effort: Pulmonary effort is normal.  ?   Breath sounds: Normal breath sounds. No wheezing or rales.  ?Musculoskeletal:  ?   Right lower leg: No edema.  ?   Left lower leg: No edema.  ? ? ?   ? ?Assessment & Recommendations:  ? ?47 y.o. [place] male with hyperlipidemia, family history of CAD, angina with probable microvascular dysfunction ? ?Chest pain: ?Minimal nonbstructive CAD but slow flow in all vessels (07/2021). Possible microvascular dysfunction. ?Symptoms improved on diltiazem 120 mg daily, Crestor 20 mg. ?No aspirin given <50% disease ? ?Mixed hyperlipidemia: ?Well controlled on Crestor 20 mg daily. ? ?F/u in 1 year ? ? ?Nigel Mormon, MD ?Pager: [phone] ?Office: [phone] ? ?

## 2022-03-21 NOTE — Progress Notes (Signed)
?[facility] ?[place] ?[place] [address] ?Dept: [phone] ?Dept Fax: [phone] ? ?Chronic Care Office Visit ? ?Subjective:  ? ? Patient ID: Leonard Henry, male    DOB: 1975-04-24, 47 y.o..   MRN: [id number] ? ?Chief Complaint  ?Patient presents with  ? Follow-up  ?  3 month f/u., c/o having cough, chest congestion x 1 week. No OTC meds taken   ? ? ?History of Present Illness: ? ?Patient is in today for reassessment of chronic medical issues. ? ?[name] has a history of coronary artery disease.  He is being managed with diltiazem and rosuvastatin. Cardiology did not feel he needed to take a daily aspirin, as his prior cath showed only a 30% stenosis of the LAD ostium. Although [name] has had chest pain previously, it was felt to be from a non-cardiac source. ?  ?[name] again complains of some chest discomfort, but notes this has been present for only about one week.  He states it feels tight across his lower chest, esp. with breathing. He has had some mild non-productive cough. He denies any nasal congesiton, sinus pressure, rhinorrhea, or sore throat. He has not run any fever. ? ?Past Medical History: ?Patient Active Problem List  ? Diagnosis Date Noted  ? Coronary artery disease 12/20/2021  ? Bilateral primary osteoarthritis of knee 12/20/2021  ? Obesity, Class II, BMI 35-39.9 12/20/2021  ? Chronic pain 12/18/2021  ? Mixed hyperlipidemia 06/08/2021  ? Lateral epicondylitis of both elbows 02/05/2019  ? Plantar fasciitis, bilateral 04/22/2018  ? ?Past Surgical History:  ?Procedure Laterality Date  ? LEFT HEART CATH AND CORONARY ANGIOGRAPHY N/A 08/22/2021  ? Procedure: LEFT HEART CATH AND CORONARY ANGIOGRAPHY;  Surgeon: Nigel Mormon, MD;  Location: [facility];  Service: Cardiovascular;  Laterality: N/A;  ? ?Family History  ?Problem Relation Age of Onset  ? Hypertension Mother   ? Diabetes Mother   ? Heart disease Mother   ?  Hyperlipidemia Mother   ? Diabetes Father   ? Hypertension Father   ? Heart disease Father   ? ?Outpatient Medications Prior to Visit  ?Medication Sig Dispense Refill  ? diltiazem (CARDIZEM CD) 120 MG 24 hr capsule Take 1 capsule (120 mg total) by mouth daily. 90 capsule 3  ? nitroGLYCERIN (NITROSTAT) 0.4 MG SL tablet Place 1 tablet (0.4 mg total) under the tongue every 5 (five) minutes as needed for chest pain. 30 tablet 3  ? rosuvastatin (CRESTOR) 20 MG tablet Take 1 tablet by mouth once daily 30 tablet 0  ? benzonatate (TESSALON) 100 MG capsule Take 1 capsule (100 mg total) by mouth 3 (three) times daily as needed for cough. 30 capsule 0  ? ?No facility-administered medications prior to visit.  ? ?Allergies  ?Allergen Reactions  ? Iopamidol Itching and Nausea And Vomiting  ?  ?Objective:  ? ?Today's Vitals  ? 03/21/22 0949  ?BP: 124/80  ?Pulse: 64  ?Temp: 97.6 ?F (36.4 ?C)  ?TempSrc: Temporal  ?SpO2: 96%  ?Weight: 240 lb 3.2 oz (109 kg)  ?Height: '5\' 10"'$  (1.778 m)  ? ?Body mass index is 34.47 kg/m?.  ? ?General: Well developed, well nourished. No acute distress. ?HEENT: Normocephalic, non-traumatic. External ears normal. EAC and TMs normal bilaterally. Conjunctiva clear. Nose   ? clear without congestion or rhinorrhea. Mucous membranes moist. Oropharynx clear. ?Neck: Supple. No lymphadenopathy. No thyromegaly. ?Lungs: Clear to auscultation bilaterally. No wheezing, rales or rhonchi. ?CV: Heart sounds distant.  RRR without murmurs or rubs. Pulses 2+ bilaterally. ?Extremities: No edema noted. ?Psych: Alert and oriented. Normal mood and affect. ? ?Health Maintenance Due  ?Topic Date Due  ? Hepatitis C Screening  Never done  ? COLONOSCOPY (Pts 45-51yr Insurance coverage will need to be confirmed)  Never done  ?   ?Assessment & Plan:  ? ?1. Coronary artery disease involving native coronary artery of native heart without angina pectoris ?Stable on diltiazem 120 mg daily. Mr. [place]villehas an appointment with cardiology  later today. ? ?2. Mixed hyperlipidemia ?Due for repeat lipids. Will plan to continue rosuvastatin 20 mg daily for now. ?- Lipid panel ? ?3. Bronchitis ?Discussed home care for viral illness, including rest and pushing fluids. Recommend hot tea with honey for sore throat symptoms. Follow-up if needed for worsening or persistent symptoms. ? ?Return in about 3 months (around 06/21/2022) for Reassessment.  ? ?[name] MD ?

## 2022-04-25 ENCOUNTER — Ambulatory Visit (AMBULATORY_SURGERY_CENTER): Payer: BC Managed Care – PPO | Admitting: *Deleted

## 2022-04-25 VITALS — Ht 70.0 in | Wt 241.0 lb

## 2022-04-25 DIAGNOSIS — Z1211 Encounter for screening for malignant neoplasm of colon: Secondary | ICD-10-CM

## 2022-04-25 MED ORDER — NA SULFATE-K SULFATE-MG SULF 17.5-3.13-1.6 GM/177ML PO SOLN: 1.0000 | ORAL | 0 refills | Status: DC

## 2022-04-25 NOTE — Progress Notes (Signed)
Patient's pre-visit was done today over the phone with the patient. Name,DOB and address verified. Patient denies any allergies to Soy. Patient denies any problems with anesthesia/sedation. Patient is not taking any diet pills or blood thinners. No home Oxygen. Insurance confirmed with patient. ? ?Prep instructions sent to pt's MyChart (if available) & mailed to pt-pt is aware. Patient understands to call us back with any questions or concerns. Patient is aware of our care-partner policy.  ? ?EMMI education assigned to the patient for the procedure, sent to [facility].  ? ?The patient is COVID-19 vaccinated.   ?

## 2022-05-14 DIAGNOSIS — M545 Low back pain, unspecified: Secondary | ICD-10-CM | POA: Diagnosis not present

## 2022-05-21 ENCOUNTER — Ambulatory Visit: Payer: BC Managed Care – PPO | Admitting: Family Medicine

## 2022-05-21 ENCOUNTER — Ambulatory Visit (INDEPENDENT_AMBULATORY_CARE_PROVIDER_SITE_OTHER): Payer: BC Managed Care – PPO

## 2022-05-21 ENCOUNTER — Encounter: Payer: Self-pay | Admitting: Family Medicine

## 2022-05-21 VITALS — BP 118/84 | HR 91 | Temp 97.7°F | Ht 70.0 in | Wt 247.0 lb

## 2022-05-21 DIAGNOSIS — E669 Obesity, unspecified: Secondary | ICD-10-CM

## 2022-05-21 DIAGNOSIS — M545 Low back pain, unspecified: Secondary | ICD-10-CM

## 2022-05-21 MED ORDER — NAPROXEN 500 MG PO TABS: 500.0000 mg | ORAL_TABLET | Freq: Two times a day (BID) | ORAL | 0 refills | Status: DC

## 2022-05-21 NOTE — Progress Notes (Signed)
[facility] [address] [place] [address] Dept: [phone] Dept Fax: [phone]  Office Visit  Subjective:    Patient ID: Leonard Henry, male    DOB: [birth year], 47 y.o..   MRN: [id number]  Chief Complaint  Patient presents with   Acute Visit    C/o having low back pain x 1 week.      History of Present Illness:  Patient is in today for evaluation of a 2 week history of low back pain. He notes pain over the midback in a band. 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 He denies any numbness, tingling, or weakness to the lower extremities. He has had no loss of bowel or bladder continence. He went to urgent care last week. he was prescribed a course of steroids and Flexeril, neither of which has given him significant relief.   Past Medical History: Patient Active Problem List   Diagnosis Date Noted   Coronary artery disease 12/20/2021   Bilateral primary osteoarthritis of knee 12/20/2021   Obesity, Class II, BMI 35-39.9 12/20/2021   Chronic pain 12/18/2021   Mixed hyperlipidemia 06/08/2021   Lateral epicondylitis of both elbows 02/05/2019   Plantar fasciitis, bilateral 04/22/2018   Past Surgical History:  Procedure Laterality Date   LEFT HEART CATH AND CORONARY ANGIOGRAPHY N/A 08/22/2021   Procedure: LEFT HEART CATH AND CORONARY ANGIOGRAPHY;  Surgeon: Nigel Mormon, MD;  Location: [facility];  Service: Cardiovascular;  Laterality: N/A;   Family History  Problem Relation Age of Onset   Hypertension Mother    Diabetes Mother    Heart disease Mother    Hyperlipidemia Mother    Diabetes Father    Hypertension Father    Heart disease Father    Colon cancer Neg Hx    Esophageal cancer Neg Hx    Rectal cancer Neg Hx    Stomach cancer Neg Hx    Outpatient Medications Prior to Visit  Medication Sig Dispense Refill   cyclobenzaprine (FLEXERIL) 5 MG tablet  Take 5 mg by mouth 3 (three) times daily as needed.     diltiazem (CARDIZEM CD) 120 MG 24 hr capsule Take 1 capsule (120 mg total) by mouth daily. 90 capsule 3   Na Sulfate-K Sulfate-Mg Sulf 17.5-3.13-1.6 GM/177ML SOLN Take 1 kit by mouth as directed. May use generic Suprep; use GOOD RX 354 mL 0   nitroGLYCERIN (NITROSTAT) 0.4 MG SL tablet Place 1 tablet (0.4 mg total) under the tongue every 5 (five) minutes as needed for chest pain. 30 tablet 3   predniSONE (DELTASONE) 20 MG tablet Take by mouth.     rosuvastatin (CRESTOR) 20 MG tablet Take 1 tablet (20 mg total) by mouth daily. 90 tablet 3   No facility-administered medications prior to visit.   Allergies  Allergen Reactions   Iopamidol Itching and Nausea And Vomiting   Eggs Or Egg-Derived Products Nausea And Vomiting   Other Itching    CAT SCAN DYE    Objective:   Today's Vitals   05/21/22 1401  BP: 118/84  Pulse: 91  Temp: 97.7 F (36.5 C)  TempSrc: Temporal  SpO2: 96%  Weight: 247 lb (112 kg)  Height: '5\' 10"'  (1.778 m)   Body mass index is 35.44 kg/m.   General: Well developed, well nourished. No acute distress. Back: Straight. Pain with palpation over the mid lumbar area in the midline and just to left of the  spine. Extremities: Full ROM. SLR -. Strength 5/5 and sensation normal in LE. DTR are 1+ bilat. Psych: Alert and oriented. Normal mood and affect.  Health Maintenance Due  Topic Date Due   Hepatitis C Screening  Never done   COLONOSCOPY (Pts 45-100yr Insurance coverage will need to be confirmed)  Never done     Imaging: Lumbar spine- Normal alignment. Minor arthritic changes. No sign of disc abnormality or significant spondylolisthesis.   Assessment & Plan:   1. Acute midline low back pain without sciatica Recommend relative rest, heat to lower back (such as a heating pad) and naproxen for 2 weeks. I recommend he return to doing stretching exercises for his back (as taught in PT 3-4 years ago). Ultimately  weight loss would help in prevention of further low back issues. If not improved in 2 weeks, follow up.  - DG Lumbar Spine Complete - naproxen (NAPROSYN) 500 MG tablet; Take 1 tablet (500 mg total) by mouth 2 (two) times daily with a meal.  Dispense: 30 tablet; Refill: 0  2. Obesity, Class II, BMI 35-39.9 I will renew referral for medical weight management.  - Amb Ref to Medical Weight Management   Return in about 2 weeks (around 06/04/2022), or if symptoms worsen or fail to improve.   [name] MD

## 2022-05-22 ENCOUNTER — Encounter: Payer: Self-pay | Admitting: Gastroenterology

## 2022-05-24 ENCOUNTER — Encounter: Payer: BC Managed Care – PPO | Admitting: Gastroenterology

## 2022-06-14 IMAGING — DX DG LUMBAR SPINE COMPLETE 4+V
4 series · 4 of 4 positions shown · non-contrast
Comparison: None Available.

CLINICAL DATA: Low back pain.

EXAM:
LUMBAR SPINE - COMPLETE 4+ VIEW

[lumbar spine ap]
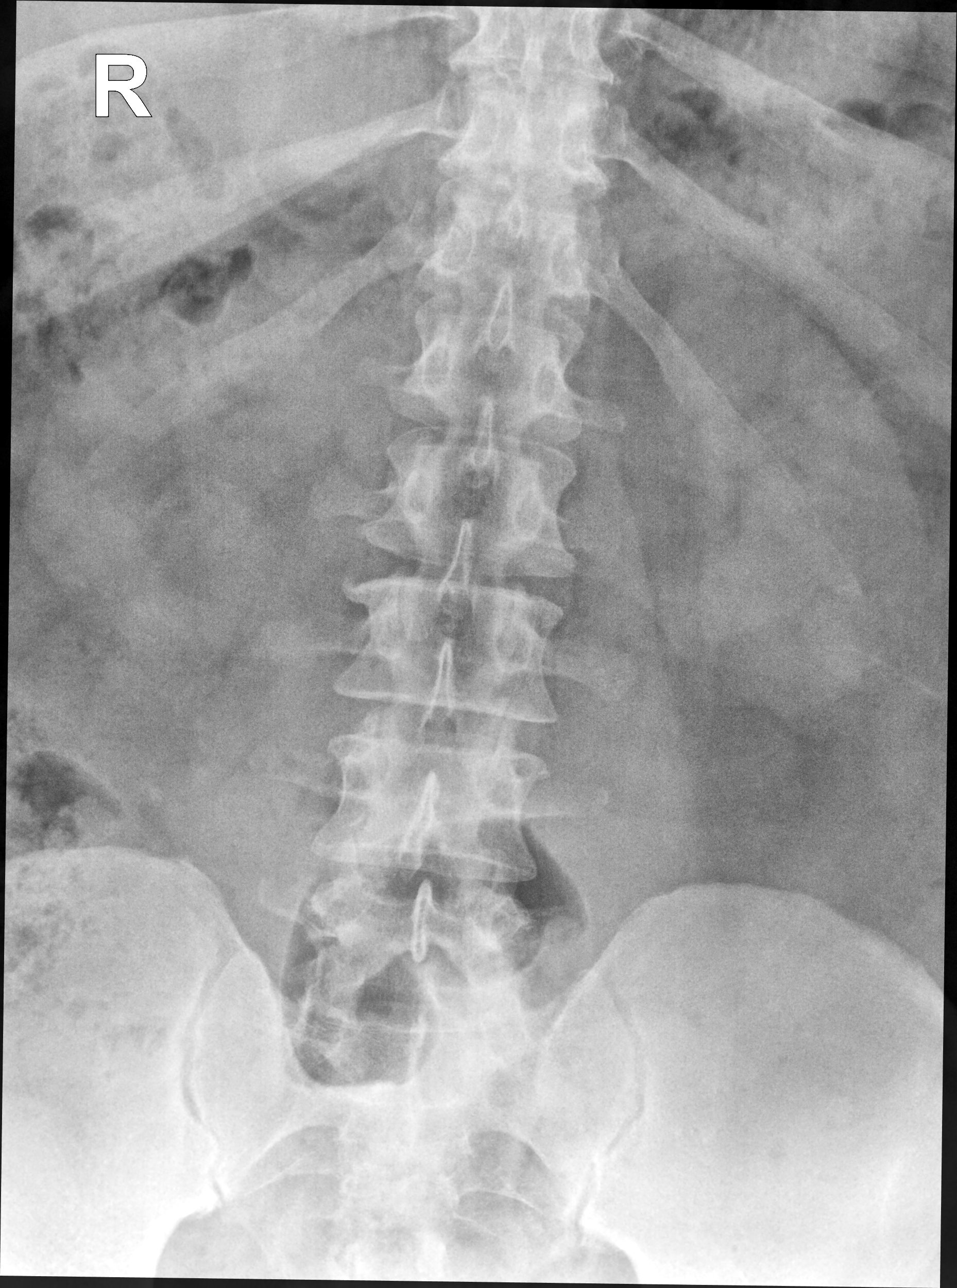

[lumbar spine lmo (1 of 2)]
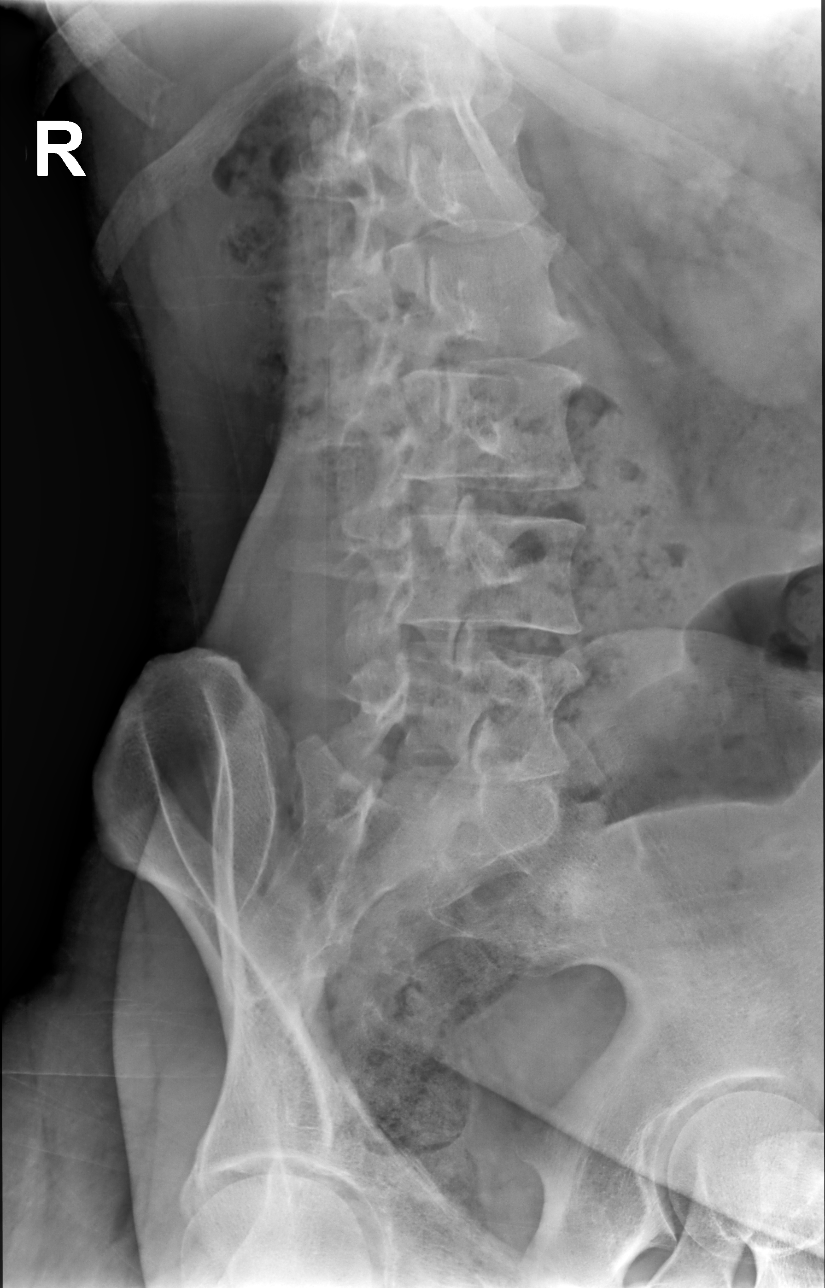

[lumbar spine lmo (2 of 2)]
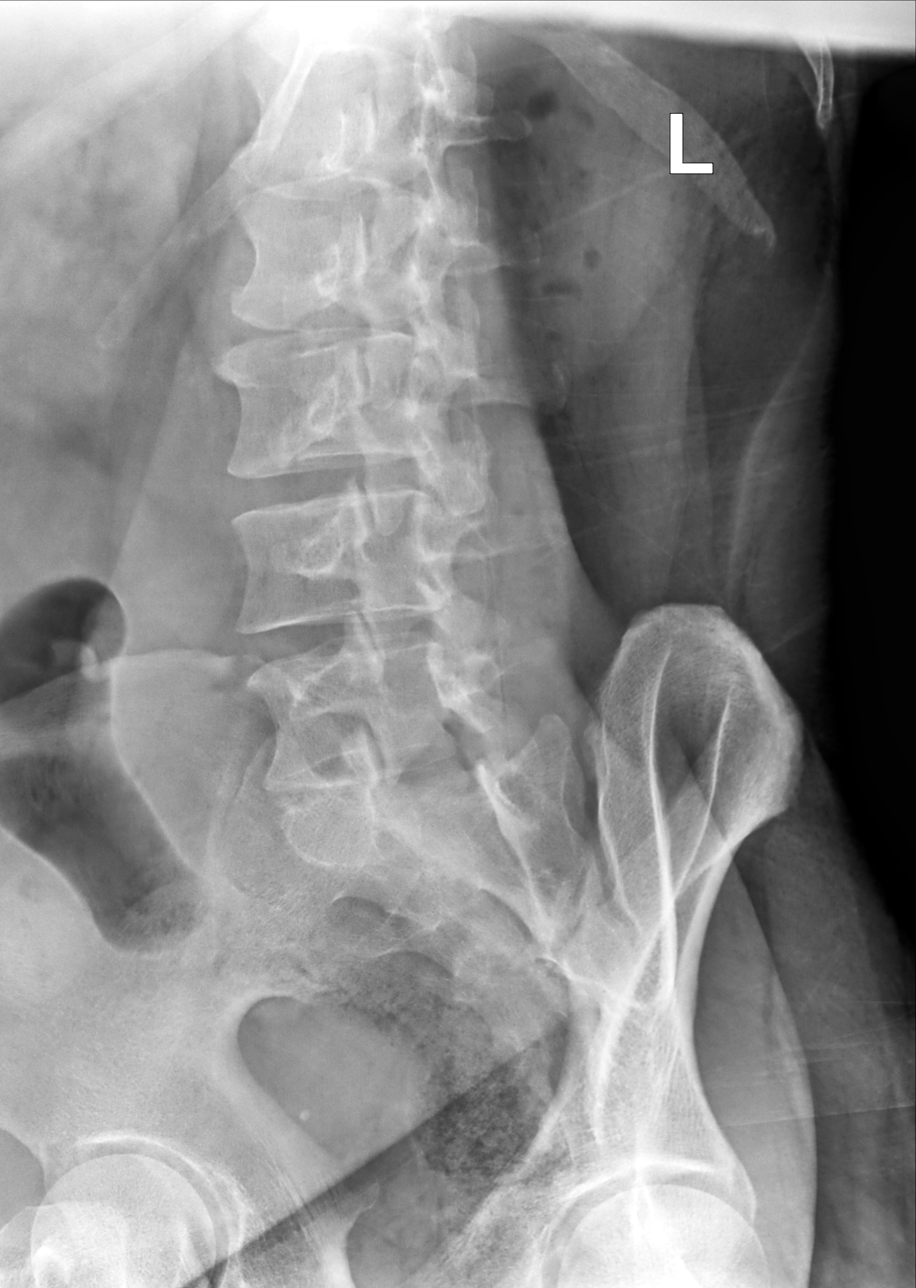

[lumbar spine lat]
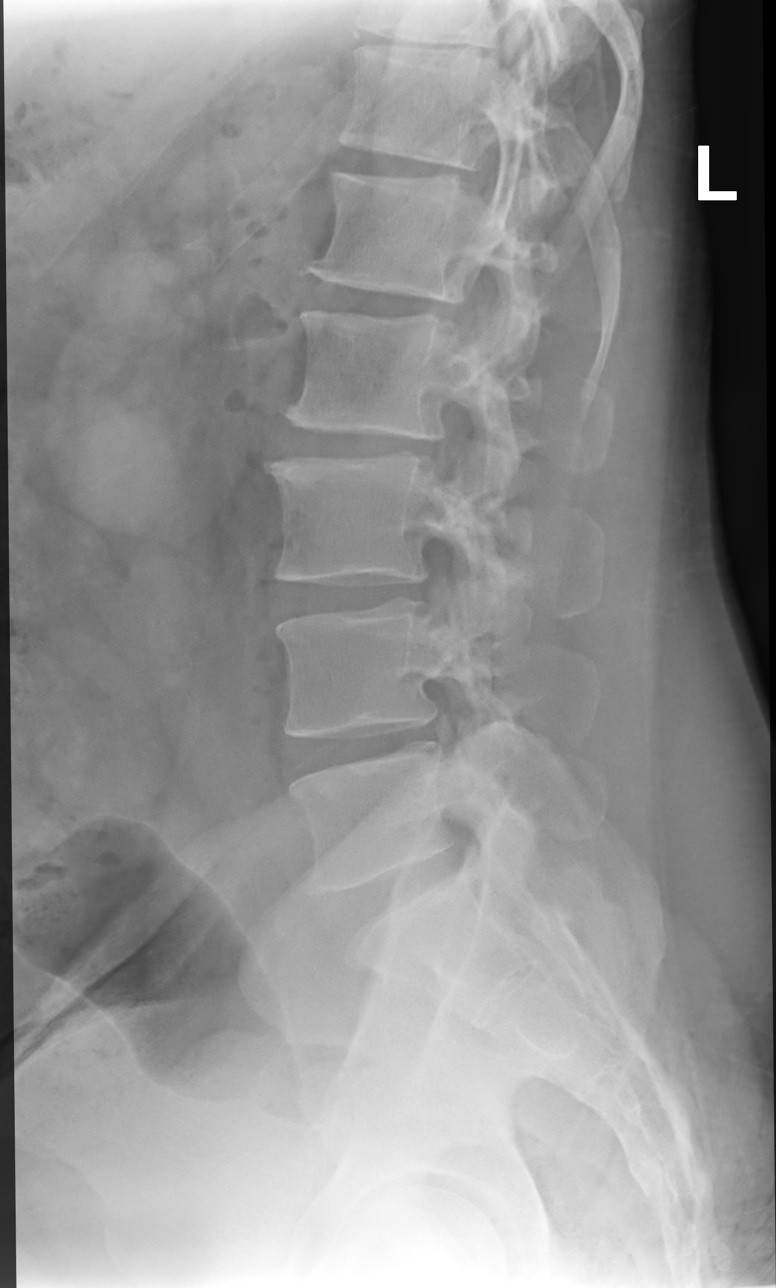

[4 of 4 positions shown; findings below may reference images not displayed]

FINDINGS: Normal anatomic alignment. No evidence for acute fracture or
dislocation. L1-2 and L2-3 degenerative disc disease. SI joints
unremarkable.
IMPRESSION: Lower lumbar spine degenerative disc disease.

## 2022-06-21 ENCOUNTER — Encounter: Payer: Self-pay | Admitting: Family Medicine

## 2022-06-21 ENCOUNTER — Ambulatory Visit (INDEPENDENT_AMBULATORY_CARE_PROVIDER_SITE_OTHER): Payer: BC Managed Care – PPO | Admitting: Family Medicine

## 2022-06-21 VITALS — BP 118/76 | HR 63 | Temp 97.5°F | Ht 70.0 in | Wt 239.0 lb

## 2022-06-21 DIAGNOSIS — I2510 Atherosclerotic heart disease of native coronary artery without angina pectoris: Secondary | ICD-10-CM | POA: Diagnosis not present

## 2022-06-21 DIAGNOSIS — E782 Mixed hyperlipidemia: Secondary | ICD-10-CM | POA: Diagnosis not present

## 2022-06-21 DIAGNOSIS — E669 Obesity, unspecified: Secondary | ICD-10-CM | POA: Diagnosis not present

## 2022-06-21 NOTE — Progress Notes (Signed)
[facility] [address] [place] [address] Dept: [phone] Dept Fax: [phone]  Chronic Care Office Visit  Subjective:    Patient ID: Leonard Henry, male    DOB: March 29, 1975, 47 y.o..   MRN: [id number]  Chief Complaint  Patient presents with   Follow-up    3 month f/u.   No concerns.  Not fasting today.     History of Present Illness:  Patient is in today for reassessment of chronic medical issues.  Leonard Henry has a history of coronary artery disease.  He is being managed with diltiazem 120 mg daily and rosuvastatin 20 mg daily. Cardiology did not feel he needed to take a daily aspirin, as his prior cath showed only a 30% stenosis of the LAD ostium. Leonard Henry denies any current chest pain. He is trying to work at an improved diet, which involves eating lean proteins and cutting out carbs. He also is going tot he gym. He notes he walks and runs on a treadmill. He raises the speed and slope to increase his exertion. He notes at times his pulse gets into the 170s.  Past Medical History: Patient Active Problem List   Diagnosis Date Noted   Coronary artery disease 12/20/2021   Bilateral primary osteoarthritis of knee 12/20/2021   Obesity, Class II, BMI 35-39.9 12/20/2021   Chronic pain 12/18/2021   Mixed hyperlipidemia 06/08/2021   Lateral epicondylitis of both elbows 02/05/2019   Plantar fasciitis, bilateral 04/22/2018   Past Surgical History:  Procedure Laterality Date   LEFT HEART CATH AND CORONARY ANGIOGRAPHY N/A 08/22/2021   Procedure: LEFT HEART CATH AND CORONARY ANGIOGRAPHY;  Surgeon: Nigel Mormon, MD;  Location: [facility];  Service: Cardiovascular;  Laterality: N/A;   Family History  Problem Relation Age of Onset   Hypertension Mother    Diabetes Mother    Heart disease Mother    Hyperlipidemia Mother    Diabetes Father    Hypertension Father    Heart disease Father    Colon cancer Neg Hx     Esophageal cancer Neg Hx    Rectal cancer Neg Hx    Stomach cancer Neg Hx    Outpatient Medications Prior to Visit  Medication Sig Dispense Refill   cyclobenzaprine (FLEXERIL) 5 MG tablet Take 5 mg by mouth 3 (three) times daily as needed.     diltiazem (CARDIZEM CD) 120 MG 24 hr capsule Take 1 capsule (120 mg total) by mouth daily. 90 capsule 3   Na Sulfate-K Sulfate-Mg Sulf 17.5-3.13-1.6 GM/177ML SOLN Take 1 kit by mouth as directed. May use generic Suprep; use GOOD RX 354 mL 0   nitroGLYCERIN (NITROSTAT) 0.4 MG SL tablet Place 1 tablet (0.4 mg total) under the tongue every 5 (five) minutes as needed for chest pain. 30 tablet 3   rosuvastatin (CRESTOR) 20 MG tablet Take 1 tablet (20 mg total) by mouth daily. 90 tablet 3   naproxen (NAPROSYN) 500 MG tablet Take 1 tablet (500 mg total) by mouth 2 (two) times daily with a meal. (Patient not taking: Reported on 06/21/2022) 30 tablet 0   predniSONE (DELTASONE) 20 MG tablet Take by mouth.     No facility-administered medications prior to visit.   Allergies  Allergen Reactions   Iopamidol Itching and Nausea And Vomiting   Eggs Or Egg-Derived Products Nausea And Vomiting   Other Itching    CAT SCAN DYE     Objective:   Today's Vitals  06/21/22 0907  BP: 118/76  Pulse: 63  Temp: (!) 97.5 F (36.4 C)  TempSrc: Temporal  SpO2: 96%  Weight: 239 lb (108.4 kg)  Height: '5\' 10"'  (1.778 m)   Body mass index is 34.29 kg/m.   General: Well developed, well nourished. No acute distress. CV: RRR without murmurs or rubs. Pulses 2+ bilaterally. Psych: Alert and oriented. Normal mood and affect.  Health Maintenance Due  Topic Date Due   Hepatitis C Screening  Never done   COLONOSCOPY (Pts 45-25yr Insurance coverage will need to be confirmed)  Never done     Assessment & Plan:   1. Coronary artery disease involving native coronary artery of native heart without angina pectoris Stable. No angina currently. We will continue his diltiazem  120 mg daily.  2. Mixed hyperlipidemia Lipids have been at goal. Continue rosuvastatin 20 mg daily.  3. Obesity, Class II, BMI 35-39.9 Weight is down 8 lbs form last visit. We discussed portions sizes and types of food for him to concentrate on. I also recommended he try to target a heart rate during exercise between 120-140, so as not to over exert his heart.  Return in about 3 months (around 09/21/2022) for Reassessment, fasting labs.   [name] MD

## 2022-07-16 ENCOUNTER — Telehealth: Payer: Self-pay | Admitting: Gastroenterology

## 2022-07-16 NOTE — Telephone Encounter (Signed)
Good Morning Dr. Bryan Lemma,   Patient called stating he needed to reschedule his colonoscopy for 7/19 at 9:30am due to scheduling conflict.     Patient was rescheduled for 9/29 at 9:30am.

## 2022-07-18 ENCOUNTER — Encounter: Payer: BC Managed Care – PPO | Admitting: Gastroenterology

## 2022-09-06 ENCOUNTER — Encounter (INDEPENDENT_AMBULATORY_CARE_PROVIDER_SITE_OTHER): Payer: BC Managed Care – PPO | Admitting: Family Medicine

## 2022-09-20 ENCOUNTER — Encounter: Payer: Self-pay | Admitting: Certified Registered Nurse Anesthetist

## 2022-09-26 ENCOUNTER — Telehealth: Payer: Self-pay | Admitting: Gastroenterology

## 2022-09-26 NOTE — Telephone Encounter (Signed)
Inbound call from patient requesting his prep instructions be printed off and given to the front desk for his procedure on 9/29 at 9:00 due to not using mychart. Please advise.

## 2022-09-26 NOTE — Telephone Encounter (Signed)
UPDATED SUPREP INSTRUCTION, PRINTED AND LEFT AT 2ND FLOOR RECEPTION DESK PER PATIENTS REQUEST.

## 2022-09-28 ENCOUNTER — Ambulatory Visit (AMBULATORY_SURGERY_CENTER): Payer: BC Managed Care – PPO | Admitting: Gastroenterology

## 2022-09-28 ENCOUNTER — Encounter: Payer: Self-pay | Admitting: Gastroenterology

## 2022-09-28 VITALS — BP 94/67 | HR 66 | Temp 97.5°F | Resp 14 | Ht 70.0 in | Wt 241.0 lb

## 2022-09-28 DIAGNOSIS — Z1211 Encounter for screening for malignant neoplasm of colon: Secondary | ICD-10-CM

## 2022-09-28 DIAGNOSIS — K5730 Diverticulosis of large intestine without perforation or abscess without bleeding: Secondary | ICD-10-CM

## 2022-09-28 DIAGNOSIS — D124 Benign neoplasm of descending colon: Secondary | ICD-10-CM

## 2022-09-28 DIAGNOSIS — D125 Benign neoplasm of sigmoid colon: Secondary | ICD-10-CM | POA: Diagnosis not present

## 2022-09-28 DIAGNOSIS — D122 Benign neoplasm of ascending colon: Secondary | ICD-10-CM

## 2022-09-28 DIAGNOSIS — D120 Benign neoplasm of cecum: Secondary | ICD-10-CM | POA: Diagnosis not present

## 2022-09-28 MED ORDER — SODIUM CHLORIDE 0.9 % IV SOLN: 500.0000 mL | INTRAVENOUS | Status: DC

## 2022-09-28 NOTE — Patient Instructions (Signed)
Read all fo the handouts given to you by your recovery room nurse.  YOU HAD AN ENDOSCOPIC PROCEDURE TODAY AT [facility]:   Refer to the procedure report that was given to you for any specific questions about what was found during the examination.  If the procedure report does not answer your questions, please call your gastroenterologist to clarify.  If you requested that your care partner not be given the details of your procedure findings, then the procedure report has been included in a sealed envelope for you to review at your convenience later.  YOU SHOULD EXPECT: Some feelings of bloating in the abdomen. Passage of more gas than usual.  Walking can help get rid of the air that was put into your GI tract during the procedure and reduce the bloating. If you had a lower endoscopy (such as a colonoscopy or flexible sigmoidoscopy) you may notice spotting of blood in your stool or on the toilet paper. If you underwent a bowel prep for your procedure, you may not have a normal bowel movement for a few days.  Please Note:  You might notice some irritation and congestion in your nose or some drainage.  This is from the oxygen used during your procedure.  There is no need for concern and it should clear up in a day or so.  SYMPTOMS TO REPORT IMMEDIATELY:  Following lower endoscopy (colonoscopy or flexible sigmoidoscopy):  Excessive amounts of blood in the stool  Significant tenderness or worsening of abdominal pains  Swelling of the abdomen that is new, acute  Fever of 100F or higher   For urgent or emergent issues, a gastroenterologist can be reached at any hour by calling [phone]. Do not use MyChart messaging for urgent concerns.    DIET:  We do recommend a small meal at first, but then you may proceed to your regular diet.  Drink plenty of fluids but you should avoid alcoholic beverages for 24 hours. Try to increase the fiber in your diet, an drink plenty of  water.  ACTIVITY:  You should plan to take it easy for the rest of today and you should NOT DRIVE or use heavy machinery until tomorrow (because of the sedation medicines used during the test).    FOLLOW UP: Our staff will call the number listed on your records the next business day following your procedure.  We will call around 7:15- 8:00 am to check on you and address any questions or concerns that you may have regarding the information given to you following your procedure. If we do not reach you, we will leave a message.     If any biopsies were taken you will be contacted by phone or by letter within the next 1-3 weeks.  Please call us at [phone] if you have not heard about the biopsies in 3 weeks.    SIGNATURES/CONFIDENTIALITY: You and/or your care partner have signed paperwork which will be entered into your electronic medical record.  These signatures attest to the fact that that the information above on your After Visit Summary has been reviewed and is understood.  Full responsibility of the confidentiality of this discharge information lies with you and/or your care-partner.

## 2022-09-28 NOTE — Progress Notes (Signed)
GASTROENTEROLOGY PROCEDURE H&P NOTE   Primary Care Physician: Haydee Salter, MD    Reason for Procedure:  Colon Cancer screening  Plan:    Colonoscopy  Patient is appropriate for endoscopic procedure(s) in the ambulatory ([facility]) setting.  The nature of the procedure, as well as the risks, benefits, and alternatives were carefully and thoroughly reviewed with the patient. Ample time for discussion and questions allowed. The patient understood, was satisfied, and agreed to proceed.     HPI: Leonard Henry is a 47 y.o. male who presents for colonoscopy for routine Colon Cancer screening.  No active GI symptoms.  No known family history of colon cancer or related malignancy.  Patient is otherwise without complaints or active issues today.  Past Medical History:  Diagnosis Date   CAD (coronary artery disease)    Chest pain    Hyperlipidemia    Hypertension     Past Surgical History:  Procedure Laterality Date   LEFT HEART CATH AND CORONARY ANGIOGRAPHY N/A 08/22/2021   Procedure: LEFT HEART CATH AND CORONARY ANGIOGRAPHY;  Surgeon: Nigel Mormon, MD;  Location: [facility];  Service: Cardiovascular;  Laterality: N/A;    Prior to Admission medications   Medication Sig Start Date End Date Taking? Authorizing Provider  cyclobenzaprine (FLEXERIL) 5 MG tablet Take 5 mg by mouth 3 (three) times daily as needed. 05/14/22   [provider]  diltiazem (CARDIZEM CD) 120 MG 24 hr capsule Take 1 capsule (120 mg total) by mouth daily. 03/21/22 06/21/22  Patwardhan, Reynold Bowen, MD  Na Sulfate-K Sulfate-Mg Sulf 17.5-3.13-1.6 GM/177ML SOLN Take 1 kit by mouth as directed. May use generic Suprep; use GOOD RX 04/25/22   Oyuki Hogan V, DO  naproxen (NAPROSYN) 500 MG tablet Take 1 tablet (500 mg total) by mouth 2 (two) times daily with a meal. Patient not taking: Reported on 06/21/2022 05/21/22   Haydee Salter, MD  nitroGLYCERIN (NITROSTAT) 0.4 MG SL tablet Place 1 tablet (0.4 mg  total) under the tongue every 5 (five) minutes as needed for chest pain. 07/17/21 06/21/22  Patwardhan, Reynold Bowen, MD  rosuvastatin (CRESTOR) 20 MG tablet Take 1 tablet (20 mg total) by mouth daily. 03/21/22   Patwardhan, Reynold Bowen, MD    Current Outpatient Medications  Medication Sig Dispense Refill   cyclobenzaprine (FLEXERIL) 5 MG tablet Take 5 mg by mouth 3 (three) times daily as needed.     diltiazem (CARDIZEM CD) 120 MG 24 hr capsule Take 1 capsule (120 mg total) by mouth daily. 90 capsule 3   Na Sulfate-K Sulfate-Mg Sulf 17.5-3.13-1.6 GM/177ML SOLN Take 1 kit by mouth as directed. May use generic Suprep; use GOOD RX 354 mL 0   naproxen (NAPROSYN) 500 MG tablet Take 1 tablet (500 mg total) by mouth 2 (two) times daily with a meal. (Patient not taking: Reported on 06/21/2022) 30 tablet 0   nitroGLYCERIN (NITROSTAT) 0.4 MG SL tablet Place 1 tablet (0.4 mg total) under the tongue every 5 (five) minutes as needed for chest pain. 30 tablet 3   rosuvastatin (CRESTOR) 20 MG tablet Take 1 tablet (20 mg total) by mouth daily. 90 tablet 3   Current Facility-Administered Medications  Medication Dose Route Frequency Provider Last Rate Last Admin   0.9 %  sodium chloride infusion  500 mL Intravenous Continuous Garnet Overfield V, DO        Allergies as of 09/28/2022 - Review Complete 09/28/2022  Allergen Reaction Noted   Iopamidol Itching and Nausea  And Vomiting 03/11/2018   Eggs or egg-derived products Nausea And Vomiting 04/25/2022   Other Itching 04/25/2022    Family History  Problem Relation Age of Onset   Hypertension Mother    Diabetes Mother    Heart disease Mother    Hyperlipidemia Mother    Diabetes Father    Hypertension Father    Heart disease Father    Colon cancer Neg Hx    Esophageal cancer Neg Hx    Rectal cancer Neg Hx    Stomach cancer Neg Hx     Social History   Socioeconomic History   Marital status: Married    Spouse name: Not on file   Number of children: 1    Years of education: Not on file   Highest education level: Not on file  Occupational History   Occupation: Architect    Comment: Self-employed  Tobacco Use   Smoking status: Never   Smokeless tobacco: Never  Vaping Use   Vaping Use: Never used  Substance and Sexual Activity   Alcohol use: Yes    Comment: SOCIAL   Drug use: No   Sexual activity: Yes  Other Topics Concern   Not on file  Social History Narrative   Not on file   Social Determinants of Health   Financial Resource Strain: Not on file  Food Insecurity: Not on file  Transportation Needs: Not on file  Physical Activity: Not on file  Stress: Not on file  Social Connections: Not on file  Intimate Partner Violence: Not on file    Physical Exam: Vital signs in last 24 hours: _0  115/75   Pulse (!) 57   Temp (!) 97.5 F (36.4 C) (Temporal)   Ht _1  (1.778 m)   Wt 241 lb (109.3 kg)   SpO2 98%   BMI 34.58 kg/m  GEN: NAD EYE: Sclerae anicteric ENT: MMM CV: Non-tachycardic Pulm: CTA b/l GI: Soft, NT/ND NEURO:  Alert & Oriented x 3   Gerrit Heck, DO [facility]   09/28/2022 9:03 AM

## 2022-09-28 NOTE — Op Note (Signed)
[facility] Patient Name: Leonard Henry Procedure Date: 09/28/2022 8:56 AM MRN: [id number] Endoscopist: Gerrit Heck , MD Age: 47 Referring MD:  Date of Birth: 12-29-1975 Gender: Male Account #: [id number] Procedure:                Colonoscopy w/ snare polypectomy Indications:              Screening for colorectal malignant neoplasm, This                            is the patient's first colonoscopy Medicines:                Monitored Anesthesia Care Procedure:                Pre-Anesthesia Assessment:                           - Prior to the procedure, a History and Physical                            was performed, and patient medications and                            allergies were reviewed. The patient's tolerance of                            previous anesthesia was also reviewed. The risks                            and benefits of the procedure and the sedation                            options and risks were discussed with the patient.                            All questions were answered, and informed consent                            was obtained. Prior Anticoagulants: The patient has                            taken no previous anticoagulant or antiplatelet                            agents. ASA Grade Assessment: III - A patient with                            severe systemic disease. After reviewing the risks                            and benefits, the patient was deemed in                            satisfactory condition to undergo the procedure.  After obtaining informed consent, the colonoscope                            was passed under direct vision. Throughout the                            procedure, the patient's blood pressure, pulse, and                            oxygen saturations were monitored continuously. The                            CF [id number] #[id number] was introduced through the anus                            and  advanced to the the cecum, identified by                            appendiceal orifice and ileocecal valve. The                            colonoscopy was performed without difficulty. The                            patient tolerated the procedure well. Following                            copious lavage, the quality of the bowel                            preparation was good. The ileocecal valve,                            appendiceal orifice, and rectum were photographed. Scope In: 9:16:29 AM Scope Out: 9:38:32 AM Scope Withdrawal Time: 0 hours 19 minutes 42 seconds  Total Procedure Duration: 0 hours 22 minutes 3 seconds  Findings:                 The perianal and digital rectal examinations were                            normal.                           A 4 mm polyp was found in the cecum. The polyp was                            sessile. The polyp was removed with a cold snare.                            Resection and retrieval were complete. Estimated                            blood loss was minimal.  Five sessile polyps were found in the sigmoid colon                            (3), descending colon (1), and ascending colon (1).                            The polyps were 3 to 6 mm in size. These polyps                            were removed with a cold snare. Resection and                            retrieval were complete. Estimated blood loss was                            minimal.                           Multiple small and large-mouthed diverticula were                            found in the sigmoid colon.                           The retroflexed view of the distal rectum and anal                            verge was normal and showed no anal or rectal                            abnormalities. Complications:            No immediate complications. Estimated Blood Loss:     Estimated blood loss was minimal. Impression:               - One 4 mm  polyp in the cecum, removed with a cold                            snare. Resected and retrieved.                           - Five 3 to 6 mm polyps in the sigmoid colon, in                            the descending colon and in the ascending colon,                            removed with a cold snare. Resected and retrieved.                           - Diverticulosis in the sigmoid colon.                           - The distal rectum and anal  verge are normal on                            retroflexion view. Recommendation:           - Patient has a contact number available for                            emergencies. The signs and symptoms of potential                            delayed complications were discussed with the                            patient. Return to normal activities tomorrow.                            Written discharge instructions were provided to the                            patient.                           - Resume previous diet.                           - Continue present medications.                           - Await pathology results.                           - Repeat colonoscopy for surveillance based on                            pathology results.                           - Return to GI office PRN. Gerrit Heck, MD 09/28/2022 9:45:26 AM

## 2022-09-28 NOTE — Progress Notes (Signed)
Pt's states no medical or surgical changes since previsit or office visit. 

## 2022-09-28 NOTE — Progress Notes (Signed)
Called to room to assist during endoscopic procedure.  Patient ID and intended procedure confirmed with present staff. Received instructions for my participation in the procedure from the performing physician.  

## 2022-09-28 NOTE — Progress Notes (Signed)
Report given to PACU, vss 

## 2022-10-01 ENCOUNTER — Telehealth: Payer: Self-pay

## 2022-10-01 NOTE — Telephone Encounter (Signed)
  Follow up Call-     09/28/2022    8:53 AM  Call back number  Post procedure Call Back phone  # [phone]  Permission to leave phone message Yes     Patient questions:  Do you have a fever, pain , or abdominal swelling? No. Pain Score  0 *  Have you tolerated food without any problems? Yes.    Have you been able to return to your normal activities? Yes.    Do you have any questions about your discharge instructions: Diet   No. Medications  No. Follow up visit  No.  Do you have questions or concerns about your Care? No.  Actions: * If pain score is 4 or above: No action needed, pain <4.

## 2022-10-05 ENCOUNTER — Encounter: Payer: Self-pay | Admitting: Gastroenterology

## 2022-11-08 ENCOUNTER — Encounter: Payer: Self-pay | Admitting: Family Medicine

## 2022-11-08 ENCOUNTER — Ambulatory Visit: Payer: BC Managed Care – PPO | Admitting: Family Medicine

## 2022-11-08 VITALS — Ht 70.0 in

## 2022-11-08 DIAGNOSIS — J069 Acute upper respiratory infection, unspecified: Secondary | ICD-10-CM | POA: Diagnosis not present

## 2022-11-08 LAB — POC COVID19 BINAXNOW: SARS Coronavirus 2 Ag: NEGATIVE

## 2022-11-08 NOTE — Progress Notes (Signed)
[facility] [address] [place] [address] Dept: [phone] Dept Fax: [phone]  Office Visit  Subjective:    Patient ID: Leonard Henry, male    DOB: 04-22-1975, 47 y.o..   MRN: [id number]  Chief Complaint  Patient presents with   Acute Visit    C/o having runny nose, HA, cough, ST, body aches x 3-4 days.       History of Present Illness:  Patient is in today for with a 3-4 day history of headache, sore throat, body aches, nasal congestion, sweats/chills, and cough. He has used some Tylenol and Mucinex for symptoms relief. He was not sure if he might need an antibiotic. He deneis any nausea/vomiting. He has had mild constipation.   Past Medical History: Patient Active Problem List   Diagnosis Date Noted   Coronary artery disease 12/20/2021   Bilateral primary osteoarthritis of knee 12/20/2021   Obesity, Class II, BMI 35-39.9 12/20/2021   Chronic pain 12/18/2021   Mixed hyperlipidemia 06/08/2021   Lateral epicondylitis of both elbows 02/05/2019   Plantar fasciitis, bilateral 04/22/2018   Past Surgical History:  Procedure Laterality Date   LEFT HEART CATH AND CORONARY ANGIOGRAPHY N/A 08/22/2021   Procedure: LEFT HEART CATH AND CORONARY ANGIOGRAPHY;  Surgeon: Nigel Mormon, MD;  Location: [facility];  Service: Cardiovascular;  Laterality: N/A;   Family History  Problem Relation Age of Onset   Hypertension Mother    Diabetes Mother    Heart disease Mother    Hyperlipidemia Mother    Diabetes Father    Hypertension Father    Heart disease Father    Colon cancer Neg Hx    Esophageal cancer Neg Hx    Rectal cancer Neg Hx    Stomach cancer Neg Hx    Outpatient Medications Prior to Visit  Medication Sig Dispense Refill   diltiazem (CARDIZEM CD) 120 MG 24 hr capsule Take 1 capsule (120 mg total) by mouth daily. 90 capsule 3   nitroGLYCERIN (NITROSTAT) 0.4 MG SL tablet Place 1 tablet (0.4 mg total)  under the tongue every 5 (five) minutes as needed for chest pain. 30 tablet 3   rosuvastatin (CRESTOR) 20 MG tablet Take 1 tablet (20 mg total) by mouth daily. 90 tablet 3   cyclobenzaprine (FLEXERIL) 5 MG tablet Take 5 mg by mouth 3 (three) times daily as needed.     Na Sulfate-K Sulfate-Mg Sulf 17.5-3.13-1.6 GM/177ML SOLN Take 1 kit by mouth as directed. May use generic Suprep; use GOOD RX 354 mL 0   naproxen (NAPROSYN) 500 MG tablet Take 1 tablet (500 mg total) by mouth 2 (two) times daily with a meal. (Patient not taking: Reported on 06/21/2022) 30 tablet 0   No facility-administered medications prior to visit.   Allergies  Allergen Reactions   Iopamidol Itching and Nausea And Vomiting   Eggs Or Egg-Derived Products Nausea And Vomiting   Other Itching    CAT SCAN DYE   Objective:   Today's Vitals   11/08/22 0959  Height: _0  (1.778 m)   Body mass index is 34.58 kg/m.   General: Well developed, well nourished. No acute distress. HEENT: Normocephalic, non-traumatic. PERRL, EOMI. Conjunctiva clear. External ears   normal. TMs are mildly bulging without redness bilaterally. Nose with moderate congestion    and rhinorrhea. Mucous membranes moist. Oropharynx clear. Good dentition. Neck: Supple. No lymphadenopathy. No thyromegaly. Lungs: Clear to auscultation bilaterally. No wheezing, rales or rhonchi. Psych: Alert  and oriented. Normal mood and affect.  Health Maintenance Due  Topic Date Due   Hepatitis C Screening  Never done   Lab Results POCT COVID: Neg.    Assessment & Plan:   1. Viral URI with cough Discussed home care for viral illness, including rest, pushing fluids, and OTC medications as needed for symptom relief. Recommend hot tea with honey for sore throat symptoms. Follow-up if needed for worsening or persistent symptoms.  - POC COVID-19   Return if symptoms worsen or fail to improve.   Haydee Salter, MD

## 2023-02-22 DIAGNOSIS — Z23 Encounter for immunization: Secondary | ICD-10-CM | POA: Diagnosis not present

## 2023-02-22 DIAGNOSIS — Z6835 Body mass index (BMI) 35.0-35.9, adult: Secondary | ICD-10-CM | POA: Diagnosis not present

## 2023-02-22 DIAGNOSIS — S61213A Laceration without foreign body of left middle finger without damage to nail, initial encounter: Secondary | ICD-10-CM | POA: Diagnosis not present

## 2023-03-13 DIAGNOSIS — M62830 Muscle spasm of back: Secondary | ICD-10-CM | POA: Diagnosis not present

## 2023-03-13 DIAGNOSIS — M545 Low back pain, unspecified: Secondary | ICD-10-CM | POA: Diagnosis not present

## 2023-03-22 ENCOUNTER — Ambulatory Visit: Payer: BC Managed Care – PPO | Admitting: Cardiology

## 2023-03-22 ENCOUNTER — Encounter: Payer: Self-pay | Admitting: Cardiology

## 2023-03-22 VITALS — BP 121/85 | HR 71 | Ht 70.0 in | Wt 242.0 lb

## 2023-03-22 DIAGNOSIS — I2510 Atherosclerotic heart disease of native coronary artery without angina pectoris: Secondary | ICD-10-CM | POA: Diagnosis not present

## 2023-03-22 DIAGNOSIS — E782 Mixed hyperlipidemia: Secondary | ICD-10-CM | POA: Diagnosis not present

## 2023-03-22 MED ORDER — ROSUVASTATIN CALCIUM 20 MG PO TABS: 20.0000 mg | ORAL_TABLET | Freq: Every day | ORAL | 3 refills | Status: DC

## 2023-03-22 MED ORDER — DILTIAZEM HCL ER COATED BEADS 120 MG PO CP24: 120.0000 mg | ORAL_CAPSULE | Freq: Every day | ORAL | 3 refills | Status: DC

## 2023-03-22 NOTE — Progress Notes (Signed)
Patient referred by Haydee Salter, MD for chest pain  Subjective:   Yarnell Newby, male    DOB: 12/24/1975, 48 y.o.   MRN: [id number]   Chief Complaint  Patient presents with   Coronary artery disease involving native coronary artery of   Follow-up     HPI  48 y.o. [place] male with hyperlipidemia, family history of CAD, angina with probable microvascular dysfunction  Patient is doing well. He is walking 30 min regularly without any complaints of chest pain.  March 2023 labs reviewed with the patient.  Initial consultation HPI 05/2021: Patient is originally from [place], [place].  He is a Nature conservation officer, works in Programmer, applications.  He is strong family history of coronary artery disease with father having had an MI at age 80, and mother having undergone stent as well as bypass surgery in her 59s.  Patient has had left-sided dull pain for almost a year.  He is not a very good historian and is unable to qualify or quantify the pain.  However, he denies any specific correlation with exertion.  For example, he usually does not feel his pain when he is working or occasionally walking.  Pain usually happens after long day of work, at rest. Patient was seen in ER in 06/2020.  At that time, ACS was excluded.  CT chest at that time showed no PE, but showed peripheral opacities concerning for possible COVID infection.  He is currently on no antihypertensive or lipid-lowering therapy.  His LDL is 124.  He does not smoke, drinks alcohol only occasionally.  His diet does not include red meat, he tries to avoid fatty food.  He eats fast food once or twice a week.    Current Outpatient Medications:    diltiazem (CARDIZEM CD) 120 MG 24 hr capsule, Take 1 capsule (120 mg total) by mouth daily., Disp: 90 capsule, Rfl: 3   nitroGLYCERIN (NITROSTAT) 0.4 MG SL tablet, Place 1 tablet (0.4 mg total) under the tongue every 5 (five) minutes as needed for chest pain., Disp: 30 tablet, Rfl: 3    rosuvastatin (CRESTOR) 20 MG tablet, Take 1 tablet (20 mg total) by mouth daily., Disp: 90 tablet, Rfl: 3  Cardiovascular and other pertinent studies:  EKG 03/22/2023: Sinus rhythm 62 bpm Low voltage in precordial leads Poor R-wave progression Nonspecific T-abnormality  Coronary angiography 08/22/2021: LM: Normal LAD: Ostial 30% stenosis Lcx: Normal RCA: Large. Minimal irregularities   Slow flow s/o microvascular dysfunction could possibly be reason for his angina Okay to skip Aspirin. Continue statin. Will try calcium channel blocker.   Lexiscan Sestamibi Stress Test 07/10/2021: Nondiagnostic ECG stress due to pharmacologic stress. There is a prominent diaphragmatic attenuation artifact noted in the inferior wall.  Superimposed on this there is a reversible mild degree, large defect in the lateral, inferior and septal regions. Overall LV systolic function is normal without regional wall motion abnormalities. Stress LV EF: 71%. No previous exam available for comparison. Low risk with normal LVEF and no wall motion abnormality. Clinical correlation recommended in a patient with BMI >35.    EKG 06/08/2021: Sinus rhythm 53 bpm Incomplete LBBB  CT cardiac scoring 06/08/2021: LM: 0 LAD: 3.3 LCx: 0 RCA: 16.7 Total score: 20  CTA PE protocol 07/20/2021: 1.  No evidence of pulmonary embolism. 2. Bibasilar peripherally predominant heterogeneously enhancing opacities. Differential considerations include atelectasis versus atypical infection, including COVID-19 given peripheral predominance.    Recent labs: 02/2022: Chol 107, TG 84, HDL 36, LDL  54  08/08/2021: Glucose 110, BUN/Cr 10/0.83. EGFR 110. Na/K 140/4.4.  H/H 15/42. MCV 82. Platelets 258 Chol 123, TG 166, HDL 36, LDL 59 Lipoprotein (a) 80  06/30/2020: Glucose 128, BUN/Cr 12/0.87. EGFR >60. Na/K 138/3.3. Rest of the CMP normal H/H 14/43. MCV 81. Platelets 265 HbA1C N/A TSH N/A  02/2020: Chol 185, TG 109, HDL 41, LDL  124   Review of Systems  Cardiovascular:  Negative for chest pain, dyspnea on exertion, leg swelling, palpitations and syncope.         Vitals:   03/22/23 0953  BP: 121/85  Pulse: 71  SpO2: 96%     Body mass index is 34.72 kg/m. Filed Weights   03/22/23 0953  Weight: 242 lb (109.8 kg)     Objective:   Physical Exam Vitals and nursing note reviewed.  Constitutional:      General: He is not in acute distress.    Appearance: He is obese.     Comments: Central obesity  Neck:     Vascular: No JVD.  Cardiovascular:     Rate and Rhythm: Normal rate and regular rhythm.     Heart sounds: Normal heart sounds. No murmur heard. Pulmonary:     Effort: Pulmonary effort is normal.     Breath sounds: Normal breath sounds. No wheezing or rales.  Musculoskeletal:     Right lower leg: No edema.     Left lower leg: No edema.         Assessment & Recommendations:   48 y.o. [place] male with hyperlipidemia, family history of CAD, angina with probable microvascular dysfunction  Chest pain: Minimal nonbstructive CAD but slow flow in all vessels (07/2021). Possible microvascular dysfunction. Symptoms improved on diltiazem 120 mg daily, Crestor 20 mg. No aspirin given <50% disease Continue the same.  Mixed hyperlipidemia: Well controlled on Crestor 20 mg daily. Continue the same.  Refilled meds today. He may get future refills from his PCP.  I will see him as needed.   F/u in 1 year   Nigel Mormon, MD Pager: [phone] Office: [phone]

## 2023-07-30 ENCOUNTER — Encounter: Payer: Self-pay | Admitting: Family Medicine

## 2023-07-30 ENCOUNTER — Ambulatory Visit (INDEPENDENT_AMBULATORY_CARE_PROVIDER_SITE_OTHER): Payer: BC Managed Care – PPO | Admitting: Family Medicine

## 2023-07-30 VITALS — BP 110/66 | HR 64 | Temp 98.2°F | Ht 70.0 in | Wt 244.4 lb

## 2023-07-30 DIAGNOSIS — R202 Paresthesia of skin: Secondary | ICD-10-CM

## 2023-07-30 DIAGNOSIS — G8929 Other chronic pain: Secondary | ICD-10-CM

## 2023-07-30 DIAGNOSIS — M545 Low back pain, unspecified: Secondary | ICD-10-CM

## 2023-07-30 DIAGNOSIS — E538 Deficiency of other specified B group vitamins: Secondary | ICD-10-CM | POA: Insufficient documentation

## 2023-07-30 DIAGNOSIS — I209 Angina pectoris, unspecified: Secondary | ICD-10-CM

## 2023-07-30 DIAGNOSIS — E782 Mixed hyperlipidemia: Secondary | ICD-10-CM

## 2023-07-30 DIAGNOSIS — Z0000 Encounter for general adult medical examination without abnormal findings: Secondary | ICD-10-CM

## 2023-07-30 DIAGNOSIS — R200 Anesthesia of skin: Secondary | ICD-10-CM | POA: Diagnosis not present

## 2023-07-30 DIAGNOSIS — E669 Obesity, unspecified: Secondary | ICD-10-CM | POA: Diagnosis not present

## 2023-07-30 DIAGNOSIS — I2510 Atherosclerotic heart disease of native coronary artery without angina pectoris: Secondary | ICD-10-CM

## 2023-07-30 LAB — TSH: TSH: 3.06 u[IU]/mL (ref 0.35–5.50)

## 2023-07-30 LAB — COMPREHENSIVE METABOLIC PANEL
ALT: 26 U/L (ref 0–53)
AST: 15 U/L (ref 0–37)
Albumin: 4.3 g/dL (ref 3.5–5.2)
Alkaline Phosphatase: 73 U/L (ref 39–117)
BUN: 17 mg/dL (ref 6–23)
CO2: 24 mEq/L (ref 19–32)
Calcium: 9.7 mg/dL (ref 8.4–10.5)
Chloride: 104 mEq/L (ref 96–112)
Creatinine, Ser: 0.89 mg/dL (ref 0.40–1.50)
GFR: 102.01 mL/min (ref >60.00)
Glucose, Bld: 119 mg/dL — ABNORMAL HIGH (ref 70–99)
Potassium: 4.3 mEq/L (ref 3.5–5.1)
Sodium: 135 mEq/L (ref 135–145)
Total Bilirubin: 0.7 mg/dL (ref 0.2–1.2)
Total Protein: 6.6 g/dL (ref 6.0–8.3)

## 2023-07-30 LAB — LIPID PANEL
Cholesterol: 130 mg/dL (ref 0–200)
HDL: 45.6 mg/dL (ref >39.00)
LDL Cholesterol: 67 mg/dL (ref 0–99)
NonHDL: 84.19
Total CHOL/HDL Ratio: 3
Triglycerides: 84 mg/dL (ref 0.0–149.0)
VLDL: 16.8 mg/dL (ref 0.0–40.0)

## 2023-07-30 LAB — FOLATE: Folate: 7.7 ng/mL (ref >5.9)

## 2023-07-30 LAB — VITAMIN B12: Vitamin B-12: 125 pg/mL — ABNORMAL LOW (ref 211–911)

## 2023-07-30 LAB — VITAMIN D 25 HYDROXY (VIT D DEFICIENCY, FRACTURES): VITD: 23.57 ng/mL — ABNORMAL LOW (ref 30.00–100.00)

## 2023-07-30 MED ORDER — NITROGLYCERIN 0.4 MG SL SUBL: 0.4000 mg | SUBLINGUAL_TABLET | SUBLINGUAL | 3 refills | Status: AC | PRN

## 2023-07-30 NOTE — Assessment & Plan Note (Signed)
Stable. I recommend regular exercise and weight loss to reduce stress on the back.

## 2023-07-30 NOTE — Assessment & Plan Note (Signed)
We will check annual lipids. Continue rosuvastatin 5 mg daily.

## 2023-07-30 NOTE — Progress Notes (Signed)
[facility] [address] [place] [address] Dept: [phone] Dept Fax: [phone]  Annual Physical Visit  Subjective:    Patient ID: Leonard Henry, male    DOB: Dec 20, 1975, 48 y.o..   MRN: [id number]  Chief Complaint  Patient presents with   Annual Exam    CPE/labs, fasting today.  No concerns.   History of Present Illness:  Patient is in today for an annual physical/preventative visit.  Leonard Henry has a history of coronary artery disease.  He is being managed with diltiazem 120 mg daily and rosuvastatin 20 mg daily. Cardiology did not feel he needed to take a daily aspirin, as his prior cath showed only a 30% stenosis of the LAD ostium. Leonard Henry denies any current chest pain. He is trying to work at an improved diet, which involves eating lean proteins and cutting out carbs. He also is going tot he gym. He notes he walks and runs on a treadmill. He raises the speed and slope to increase his exertion. He notes at times his pulse gets into the 170s.   Review of Systems  Constitutional:  Negative for chills, diaphoresis, fever, malaise/fatigue and weight loss.  HENT:  Negative for congestion, ear pain, hearing loss, sinus pain, sore throat and tinnitus.   Eyes:  Negative for blurred vision, pain, discharge and redness.  Respiratory:  Negative for cough, shortness of breath and wheezing.   Cardiovascular:  Negative for chest pain and palpitations.  Gastrointestinal:  Positive for constipation. Negative for abdominal pain, diarrhea, heartburn, nausea and vomiting.       Notes he has difficulty with infrequent, hard stools. He eats some salads, but does not take any fiber supplements.  Musculoskeletal:  Positive for back pain. Negative for joint pain and myalgias.       Long-standing history of low back pain. This remains consistent. No sciatica.  Skin:  Negative for itching and rash.  Neurological:  Positive for tingling.        Notes intermittent tingling in his hands and feet. Has only a small amount of alcohol twice a week.  Psychiatric/Behavioral:  Negative for depression. The patient is not nervous/anxious.    Past Medical History: Patient Active Problem List   Diagnosis Date Noted   Coronary artery disease 12/20/2021   Bilateral primary osteoarthritis of knee 12/20/2021   Obesity, Class II, BMI 35-39.9 12/20/2021   Chronic low back pain 12/18/2021   Mixed hyperlipidemia 06/08/2021   Lateral epicondylitis of both elbows 02/05/2019   Plantar fasciitis, bilateral 04/22/2018   Past Surgical History:  Procedure Laterality Date   LEFT HEART CATH AND CORONARY ANGIOGRAPHY N/A 08/22/2021   Procedure: LEFT HEART CATH AND CORONARY ANGIOGRAPHY;  Surgeon: Elder Negus, MD;  Location: [facility];  Service: Cardiovascular;  Laterality: N/A;   Family History  Problem Relation Age of Onset   Hypertension Mother    Diabetes Mother    Heart disease Mother    Hyperlipidemia Mother    Diabetes Father    Hypertension Father    Heart disease Father    Colon cancer Neg Hx    Esophageal cancer Neg Hx    Rectal cancer Neg Hx    Stomach cancer Neg Hx    Outpatient Medications Prior to Visit  Medication Sig Dispense Refill   diltiazem (CARDIZEM CD) 120 MG 24 hr capsule Take 1 capsule (120 mg total) by mouth daily. 90 capsule 3   nitroGLYCERIN (NITROSTAT) 0.4 MG  SL tablet Place 1 tablet (0.4 mg total) under the tongue every 5 (five) minutes as needed for chest pain. 30 tablet 3   rosuvastatin (CRESTOR) 20 MG tablet Take 1 tablet (20 mg total) by mouth daily. 90 tablet 3   No facility-administered medications prior to visit.   Allergies  Allergen Reactions   Iopamidol Itching and Nausea And Vomiting   Egg-Derived Products Nausea And Vomiting   Iodinated Contrast Media Other (See Comments)   Iodine Other (See Comments)   Other Itching    CAT SCAN DYE   Objective:   Today's Vitals   07/30/23 0911   BP: 110/66  Pulse: 64  Temp: 98.2 F (36.8 C)  TempSrc: Temporal  SpO2: 96%  Weight: 244 lb 6.4 oz (110.9 kg)  Height: 5\' 10"  (1.778 m)   Body mass index is 35.07 kg/m.   General: Well developed, well nourished. No acute distress. HEENT: Normocephalic, non-traumatic. PERRL, EOMI. Conjunctiva clear. External ears normal. EAC and TMs   normal bilaterally. Nose clear without congestion or rhinorrhea. Mucous membranes moist. Oropharynx clear. Fair   dentition. Neck: Supple. No lymphadenopathy. No thyromegaly. Lungs: Clear to auscultation bilaterally. No wheezing, rales or rhonchi. CV: RRR without murmurs or rubs. Pulses 2+ bilaterally. Abdomen: Soft, non-tender. Bowel sounds positive, normal pitch and frequency. No hepatosplenomegaly. No   rebound or guarding. Extremities: Full ROM. No joint swelling or tenderness. Strength 5/5 and equal bilaterally. No edema noted. Skin: Warm and dry. No rashes. Psych: Alert and oriented. Normal mood and affect.  Health Maintenance Due  Topic Date Due   Hepatitis C Screening  Never done     Assessment & Plan:   Problem List Items Addressed This Visit       Cardiovascular and Mediastinum   Coronary artery disease    Stable. No chest pain. Cotninue diltiazem 120 mg daily. I will renew his NTG.       Relevant Medications   nitroGLYCERIN (NITROSTAT) 0.4 MG SL tablet   Other Relevant Orders   Comprehensive metabolic panel     Other   Chronic low back pain    Stable. I recommend regular exercise and weight loss to reduce stress on the back.      Mixed hyperlipidemia    We will check annual lipids. Continue rosuvastatin 5 mg daily.      Relevant Medications   nitroGLYCERIN (NITROSTAT) 0.4 MG SL tablet   Other Relevant Orders   Lipid panel   Obesity, Class II, BMI 35-39.9    I will refer Leonard [facility] to try and assist with reducing weight and CV risk.      Relevant Orders   Amb Ref to Medical Weight  Management   Other Visit Diagnoses     Annual physical exam    -  Primary   Discussed including regular exercise in daily routine. UTD on screenings and immunizations.   Numbness and tingling- intermittent, hands and feet       Etiology is unclear. I will conduct lab evaluaiton for common causes.   Relevant Orders   Comprehensive metabolic panel   VITAMIN D 25 Hydroxy (Vit-D Deficiency, Fractures)   TSH   Vitamin B12   Folate   Angina pectoris (HCC)       Relevant Medications   nitroGLYCERIN (NITROSTAT) 0.4 MG SL tablet       Return in about 6 months (around 01/30/2024) for Reassessment.   Loyola Mast, MD

## 2023-07-30 NOTE — Assessment & Plan Note (Signed)
Stable. No chest pain. Cotninue diltiazem 120 mg daily. I will renew his NTG.

## 2023-07-30 NOTE — Assessment & Plan Note (Signed)
I will refer Leonard Henry tot [facility] to try and assist with reducing weight and CV risk.

## 2023-07-31 ENCOUNTER — Ambulatory Visit (INDEPENDENT_AMBULATORY_CARE_PROVIDER_SITE_OTHER): Payer: BC Managed Care – PPO

## 2023-07-31 DIAGNOSIS — E538 Deficiency of other specified B group vitamins: Secondary | ICD-10-CM | POA: Diagnosis not present

## 2023-07-31 MED ORDER — CYANOCOBALAMIN 1000 MCG/ML IJ SOLN
1000.0000 ug | Freq: Once | INTRAMUSCULAR | Status: AC
Start: 2023-07-31 — End: 2023-07-31
  Administered 2023-07-31: 1000 ug via INTRAMUSCULAR

## 2023-07-31 NOTE — Progress Notes (Signed)
Per orders of Herbie Drape, injection of B12 given by Mickle Plumb, cma.  Patient tolerated injection well.  He will return on 08/06/23 for his 2nd weekly out of 4 shots.  Dm/cma

## 2023-08-04 DIAGNOSIS — M62830 Muscle spasm of back: Secondary | ICD-10-CM | POA: Diagnosis not present

## 2023-08-06 ENCOUNTER — Ambulatory Visit: Payer: BC Managed Care – PPO

## 2023-10-28 ENCOUNTER — Encounter: Payer: Self-pay | Admitting: Family Medicine

## 2023-10-28 ENCOUNTER — Ambulatory Visit: Payer: BC Managed Care – PPO | Admitting: Family Medicine

## 2023-10-28 VITALS — BP 118/76 | HR 69 | Temp 98.2°F | Ht 70.0 in | Wt 226.6 lb

## 2023-10-28 DIAGNOSIS — L301 Dyshidrosis [pompholyx]: Secondary | ICD-10-CM

## 2023-10-28 MED ORDER — CLOBETASOL PROPIONATE 0.05 % EX CREA
1.0000 | TOPICAL_CREAM | Freq: Two times a day (BID) | CUTANEOUS | 0 refills | Status: DC
Start: 2023-10-28 — End: 2024-03-09

## 2023-10-28 NOTE — Progress Notes (Signed)
[facility] [address] [place] [address] Dept: [phone] Dept Fax: [phone]  Office Visit  Subjective:    Patient ID: Leonard Henry, male    DOB: 1975/03/16, 48 y.o..   MRN: [id number]  Chief Complaint  Patient presents with   Rash    C/o having a rash on both feet x 1 month.     History of Present Illness:  Patient is in today for complaining of a 1 month history of a rash on his feet. He notes this has been pruritic. He has tried to treat this with lotions and OTC anti-itch creams. He He denies his feet being wet very much and does not have an issue with excessive sweating.  Past Medical History: Patient Active Problem List   Diagnosis Date Noted   Vitamin B12 deficiency 07/30/2023   Coronary artery disease 12/20/2021   Bilateral primary osteoarthritis of knee 12/20/2021   Obesity, Class II, BMI 35-39.9 12/20/2021   Chronic low back pain 12/18/2021   Mixed hyperlipidemia 06/08/2021   Lateral epicondylitis of both elbows 02/05/2019   Plantar fasciitis, bilateral 04/22/2018   Past Surgical History:  Procedure Laterality Date   LEFT HEART CATH AND CORONARY ANGIOGRAPHY N/A 08/22/2021   Procedure: LEFT HEART CATH AND CORONARY ANGIOGRAPHY;  Surgeon: Elder Negus, MD;  Location: [facility];  Service: Cardiovascular;  Laterality: N/A;   Family History  Problem Relation Age of Onset   Hypertension Mother    Diabetes Mother    Heart disease Mother    Hyperlipidemia Mother    Diabetes Father    Hypertension Father    Heart disease Father    Colon cancer Neg Hx    Esophageal cancer Neg Hx    Rectal cancer Neg Hx    Stomach cancer Neg Hx    Outpatient Medications Prior to Visit  Medication Sig Dispense Refill   diltiazem (CARDIZEM CD) 120 MG 24 hr capsule Take 1 capsule (120 mg total) by mouth daily. 90 capsule 3   nitroGLYCERIN (NITROSTAT) 0.4 MG SL tablet Place 1 tablet (0.4 mg total) under  the tongue every 5 (five) minutes as needed for chest pain. 30 tablet 3   rosuvastatin (CRESTOR) 20 MG tablet Take 1 tablet (20 mg total) by mouth daily. 90 tablet 3   No facility-administered medications prior to visit.   Allergies  Allergen Reactions   Iopamidol Itching and Nausea And Vomiting   Egg-Derived Products Nausea And Vomiting   Iodinated Contrast Media Other (See Comments)   Iodine Other (See Comments)   Other Itching    CAT SCAN DYE     Objective:   Today's Vitals   10/28/23 0837  BP: 118/76  Pulse: 69  Temp: 98.2 F (36.8 C)  TempSrc: Temporal  SpO2: 99%  Weight: 226 lb 9.6 oz (102.8 kg)  Height: 5\' 10"  (1.778 m)   Body mass index is 32.51 kg/m.   General: Well developed, well nourished. No acute distress. Skin: Warm and dry. There are red macular rashes on both feet, R>L. This is blanchable. There is a mild roughness to the surface.      Psych: Alert and oriented. Normal mood and affect.  Health Maintenance Due  Topic Date Due   Hepatitis C Screening  Never done     Assessment & Plan:   Problem List Items Addressed This Visit   None Visit Diagnoses     Dyshidrotic foot dermatitis    -  Primary   Relevant Medications   clobetasol cream (TEMOVATE) 0.05 %       Return in about 2 weeks (around 11/11/2023) for Reassessment.   Loyola Mast, MD

## 2023-11-11 ENCOUNTER — Ambulatory Visit: Payer: BC Managed Care – PPO | Admitting: Family Medicine

## 2023-11-11 ENCOUNTER — Encounter: Payer: Self-pay | Admitting: Family Medicine

## 2023-11-11 ENCOUNTER — Ambulatory Visit: Payer: BC Managed Care – PPO

## 2023-11-11 VITALS — BP 124/80 | HR 66 | Temp 98.7°F | Ht 70.0 in | Wt 229.0 lb

## 2023-11-11 DIAGNOSIS — E538 Deficiency of other specified B group vitamins: Secondary | ICD-10-CM | POA: Diagnosis not present

## 2023-11-11 DIAGNOSIS — E559 Vitamin D deficiency, unspecified: Secondary | ICD-10-CM | POA: Diagnosis not present

## 2023-11-11 DIAGNOSIS — E66811 Obesity, class 1: Secondary | ICD-10-CM

## 2023-11-11 DIAGNOSIS — M51369 Other intervertebral disc degeneration, lumbar region without mention of lumbar back pain or lower extremity pain: Secondary | ICD-10-CM | POA: Diagnosis not present

## 2023-11-11 DIAGNOSIS — G629 Polyneuropathy, unspecified: Secondary | ICD-10-CM | POA: Diagnosis not present

## 2023-11-11 DIAGNOSIS — L301 Dyshidrosis [pompholyx]: Secondary | ICD-10-CM | POA: Insufficient documentation

## 2023-11-11 DIAGNOSIS — R7303 Prediabetes: Secondary | ICD-10-CM | POA: Insufficient documentation

## 2023-11-11 DIAGNOSIS — R739 Hyperglycemia, unspecified: Secondary | ICD-10-CM

## 2023-11-11 LAB — BASIC METABOLIC PANEL
BUN: 13 mg/dL (ref 6–23)
CO2: 26 meq/L (ref 19–32)
Calcium: 9.8 mg/dL (ref 8.4–10.5)
Chloride: 106 meq/L (ref 96–112)
Creatinine, Ser: 0.91 mg/dL (ref 0.40–1.50)
GFR: 100.13 mL/min (ref >60.00)
Glucose, Bld: 111 mg/dL — ABNORMAL HIGH (ref 70–99)
Potassium: 3.8 meq/L (ref 3.5–5.1)
Sodium: 141 meq/L (ref 135–145)

## 2023-11-11 LAB — VITAMIN D 25 HYDROXY (VIT D DEFICIENCY, FRACTURES): VITD: 27.95 ng/mL — ABNORMAL LOW (ref 30.00–100.00)

## 2023-11-11 LAB — HEMOGLOBIN A1C: Hgb A1c MFr Bld: 6 % (ref 4.6–6.5)

## 2023-11-11 LAB — VITAMIN B12: Vitamin B-12: 134 pg/mL — ABNORMAL LOW (ref 211–911)

## 2023-11-11 MED ORDER — MOMETASONE FUROATE 0.1 % EX CREA
TOPICAL_CREAM | CUTANEOUS | 1 refills | Status: DC
Start: 2023-11-11 — End: 2024-03-09

## 2023-11-11 NOTE — Progress Notes (Addendum)
[facility] [address] [place] [address] Dept: [phone] Dept Fax: [phone]  Office Visit  Subjective:    Patient ID: Leonard Henry, male    DOB: 1975/06/06, 48 y.o..   MRN: [id number]  Chief Complaint  Patient presents with   Rash    on feet   History of Present Illness:  Patient is in today for reassessment of the rash on his feet. I had seen him 2 weeks ago with a pruritic rash on his feet. This appeared to be a dyshidrotic eczema. I prescribed clobetasol cream. He feels the rash has improved.   At Leonard Henry physical this summer, he had noted intermittent tingling and numbness in his hands and feet. His evaluation had shown Vitamin B12 deficiency. I had requested he receive Vitamin B12 weekly for 4 weeks, but he only ever came for a single injection. Today, he continues to note the numbness issue. He has little alcohol use (once a week). He does have a history of low back pain with degenerative disc disease.  Past Medical History: Patient Active Problem List   Diagnosis Date Noted   Lumbar degenerative disc disease 11/11/2023   Vitamin D insufficiency 11/11/2023   Vitamin B12 deficiency 07/30/2023   Coronary artery disease 12/20/2021   Bilateral primary osteoarthritis of knee 12/20/2021   Obesity, Class II, BMI 35-39.9 12/20/2021   Mixed hyperlipidemia 06/08/2021   Lateral epicondylitis of both elbows 02/05/2019   Plantar fasciitis, bilateral 04/22/2018   Past Surgical History:  Procedure Laterality Date   LEFT HEART CATH AND CORONARY ANGIOGRAPHY N/A 08/22/2021   Procedure: LEFT HEART CATH AND CORONARY ANGIOGRAPHY;  Surgeon: Elder Negus, MD;  Location: [facility];  Service: Cardiovascular;  Laterality: N/A;   Family History  Problem Relation Age of Onset   Hypertension Mother    Diabetes Mother    Heart disease Mother    Hyperlipidemia Mother    Diabetes Father    Hypertension Father     Heart disease Father    Colon cancer Neg Hx    Esophageal cancer Neg Hx    Rectal cancer Neg Hx    Stomach cancer Neg Hx    Outpatient Medications Prior to Visit  Medication Sig Dispense Refill   clobetasol cream (TEMOVATE) 0.05 % Apply 1 Application topically 2 (two) times daily. 30 g 0   diltiazem (CARDIZEM CD) 120 MG 24 hr capsule Take 1 capsule (120 mg total) by mouth daily. 90 capsule 3   nitroGLYCERIN (NITROSTAT) 0.4 MG SL tablet Place 1 tablet (0.4 mg total) under the tongue every 5 (five) minutes as needed for chest pain. 30 tablet 3   rosuvastatin (CRESTOR) 20 MG tablet Take 1 tablet (20 mg total) by mouth daily. 90 tablet 3   No facility-administered medications prior to visit.   Allergies  Allergen Reactions   Iopamidol Itching and Nausea And Vomiting   Egg-Derived Products Nausea And Vomiting   Iodinated Contrast Media Other (See Comments)   Iodine Other (See Comments)   Other Itching    CAT SCAN DYE     Objective:   Today's Vitals   11/11/23 1034  BP: 124/80  Pulse: 66  Temp: 98.7 F (37.1 C)  TempSrc: Temporal  SpO2: 99%  Weight: 229 lb (103.9 kg)  Height: 5\' 10"  (1.778 m)   Body mass index is 32.86 kg/m.   General: Well developed, well nourished. No acute distress. Feet: The areas of rash are no  longer as prominent and appear to be healing. 5.07 monofilament testign shows loss of   sensation to both feet. Psych: Alert and oriented. Normal mood and affect.  Health Maintenance Due  Topic Date Due   Hepatitis C Screening  Never done   Lab Results    Latest Ref Rng & Units 07/30/2023    9:47 AM 08/08/2021    9:19 AM 07/19/2020    2:50 AM  CMP  Glucose 70 - 99 mg/dL 284  132    BUN 6 - 23 mg/dL 17  10    Creatinine 4.40 - 1.50 mg/dL 1.02  7.25    Sodium 366 - 145 mEq/L 135  140    Potassium 3.5 - 5.1 mEq/L 4.3  4.4    Chloride 96 - 112 mEq/L 104  106    CO2 19 - 32 mEq/L 24  19    Calcium 8.4 - 10.5 mg/dL 9.7  9.5    Total Protein 6.0 - 8.3  g/dL 6.6   6.6   Total Bilirubin 0.2 - 1.2 mg/dL 0.7   0.9   Alkaline Phos 39 - 117 U/L 73   61   AST 0 - 37 U/L 15   18   ALT 0 - 53 U/L 26   23    Last thyroid functions Lab Results  Component Value Date   TSH 3.06 07/30/2023   Last vitamin D Lab Results  Component Value Date   VD25OH 23.57 (L) 07/30/2023   Last vitamin B12 and Folate Lab Results  Component Value Date   VITAMINB12 125 (L) 07/30/2023   FOLATE 7.7 07/30/2023   Assessment & Plan:   Problem List Items Addressed This Visit       Nervous and Auditory   Peripheral polyneuropathy    Likely due to Vitamin B12 deficiency. However, I cannot rule out a radiculopathy related to his chronic low back issues. I will refer to neurology to consider NCS.      Relevant Orders   Vitamin B12   Basic metabolic panel   Hemoglobin A1c   Ambulatory referral to Neurology   VITAMIN D 25 Hydroxy (Vit-D Deficiency, Fractures)     Musculoskeletal and Integument   Dyshidrotic foot dermatitis - Primary    Improved. I will switch him to mometasone cream (medium potency) for complete resolution and to treat flares.      Relevant Medications   mometasone (ELOCON) 0.1 % cream   Lumbar degenerative disc disease    Leonard Henry had seen Leonard Henry with [facility] and had a spinal steroid injection about a month ago. He noted only temporary relief of his low back pain. It is possible his peripheral neuropathy could represent a radiculopathy due to low back issues. Iw ill have him see neurology ot assess further.      Relevant Orders   Ambulatory referral to Neurology     Other   Class 1 obesity    Leonard Henry requests to meet with a nutritionist in order to work on his weight loss. I recommend [facility] at [facility].      Relevant Orders   Amb Ref to Medical Weight Management   Hyperglycemia    I will repeat a blood sugar and check an A1c, as he has had some mild hyperglycemia.      Relevant  Orders   Basic metabolic panel   Hemoglobin A1c   Vitamin B12 deficiency    Leonard Henry  did not complete a course of B12 injections and is not on oral Vit. B12 now. I will reassess his level. If his B12 remains low, we will try and restart the Vitamin B12 injections weekly for 4 weeks, then start a daily Vit. B12 supplement. This could be the source of his neuropathy.      Relevant Orders   Vitamin B12   Vitamin D insufficiency    I will reassess his Vitamin D level.      Relevant Orders   VITAMIN D 25 Hydroxy (Vit-D Deficiency, Fractures)    Return in about 4 weeks (around 12/09/2023) for Reassessment.   Loyola Mast, MD

## 2023-11-11 NOTE — Assessment & Plan Note (Signed)
Mr. Capponi had seen Dr. Tami Ribas with [facility] and had a spinal steroid injection about a month ago. He noted only temporary relief of his low back pain. It is possible his peripheral neuropathy could represent a radiculopathy due to low back issues. Iw ill have him see neurology ot assess further.

## 2023-11-11 NOTE — Assessment & Plan Note (Signed)
I will reassess his Vitamin D level.

## 2023-11-11 NOTE — Assessment & Plan Note (Signed)
Improved. I will switch him to mometasone cream (medium potency) for complete resolution and to treat flares.

## 2023-11-11 NOTE — Assessment & Plan Note (Signed)
Leonard Henry did not complete a course of B12 injections and is not on oral Vit. B12 now. I will reassess his level. If his B12 remains low, we will try and restart the Vitamin B12 injections weekly for 4 weeks, then start a daily Vit. B12 supplement. This could be the source of his neuropathy.

## 2023-11-11 NOTE — Assessment & Plan Note (Signed)
Leonard Henry requests to meet with a nutritionist in order to work on his weight loss. I recommend he be seen at [facility].

## 2023-11-11 NOTE — Addendum Note (Signed)
Addended by: Loyola Mast on: 11/11/2023 12:01 PM   Modules accepted: Orders

## 2023-11-11 NOTE — Assessment & Plan Note (Signed)
I will repeat a blood sugar and check an A1c, as he has had some mild hyperglycemia.

## 2023-11-11 NOTE — Assessment & Plan Note (Signed)
Likely due to Vitamin B12 deficiency. However, I cannot rule out a radiculopathy related to his chronic low back issues. I will refer to neurology to consider NCS.

## 2023-11-12 ENCOUNTER — Telehealth: Payer: Self-pay | Admitting: Family Medicine

## 2023-11-12 ENCOUNTER — Ambulatory Visit (INDEPENDENT_AMBULATORY_CARE_PROVIDER_SITE_OTHER): Payer: BC Managed Care – PPO

## 2023-11-12 DIAGNOSIS — E538 Deficiency of other specified B group vitamins: Secondary | ICD-10-CM

## 2023-11-12 MED ORDER — CYANOCOBALAMIN 1000 MCG/ML IJ SOLN
1000.0000 ug | Freq: Once | INTRAMUSCULAR | Status: AC
Start: 2023-11-12 — End: 2023-11-12
  Administered 2023-11-12: 1000 ug via INTRAMUSCULAR

## 2023-11-12 NOTE — Telephone Encounter (Signed)
Pt is wanting to schedule his next B12 injection in a week on 11/19/23, he wants 8:30 again. Is this Ok and can someone override this?

## 2023-11-12 NOTE — Progress Notes (Signed)
Per orders of Dr Veto Kemps, injection of B12 given in LT deltoid by Mickle Plumb, cma.  Patient tolerated injection well.  He  will return on 11/19/23 @ 8:30 am for his 2nd of 4 weekly shots. Dm/cma

## 2023-11-14 NOTE — Telephone Encounter (Signed)
yes

## 2023-11-15 NOTE — Telephone Encounter (Signed)
Pt scheduled 11/19/23 for NV to receive B12 injection.

## 2023-11-19 ENCOUNTER — Ambulatory Visit: Payer: BC Managed Care – PPO

## 2023-11-21 ENCOUNTER — Ambulatory Visit (INDEPENDENT_AMBULATORY_CARE_PROVIDER_SITE_OTHER): Payer: BC Managed Care – PPO

## 2023-11-21 DIAGNOSIS — E538 Deficiency of other specified B group vitamins: Secondary | ICD-10-CM | POA: Diagnosis not present

## 2023-11-21 MED ORDER — CYANOCOBALAMIN 1000 MCG/ML IJ SOLN
1000.0000 ug | Freq: Once | INTRAMUSCULAR | Status: AC
Start: 2023-11-21 — End: 2023-11-21
  Administered 2023-11-21: 1000 ug via INTRAMUSCULAR

## 2023-11-21 NOTE — Progress Notes (Addendum)
Pt here for monthly B12 injection per Dr. Veto Kemps MD*  B12 given IM Left deltoid and pt tolerated injection well.

## 2023-11-22 ENCOUNTER — Encounter: Payer: Self-pay | Admitting: Neurology

## 2023-12-03 ENCOUNTER — Ambulatory Visit: Payer: BC Managed Care – PPO

## 2023-12-09 ENCOUNTER — Telehealth: Payer: Self-pay | Admitting: Family Medicine

## 2023-12-09 ENCOUNTER — Ambulatory Visit: Payer: BC Managed Care – PPO | Admitting: Family Medicine

## 2023-12-09 NOTE — Telephone Encounter (Signed)
Pt did not show up for appt today. Pls advise.

## 2023-12-10 NOTE — Telephone Encounter (Signed)
1st no show, letter sent via mychart & sent text

## 2024-01-08 ENCOUNTER — Ambulatory Visit: Payer: BC Managed Care – PPO | Admitting: Neurology

## 2024-01-08 ENCOUNTER — Encounter: Payer: Self-pay | Admitting: Neurology

## 2024-01-08 DIAGNOSIS — Z029 Encounter for administrative examinations, unspecified: Secondary | ICD-10-CM

## 2024-03-02 ENCOUNTER — Other Ambulatory Visit

## 2024-03-02 ENCOUNTER — Ambulatory Visit (INDEPENDENT_AMBULATORY_CARE_PROVIDER_SITE_OTHER): Payer: BC Managed Care – PPO | Admitting: Neurology

## 2024-03-02 ENCOUNTER — Encounter: Payer: Self-pay | Admitting: Neurology

## 2024-03-02 VITALS — BP 111/79 | HR 91 | Ht 70.0 in | Wt 241.0 lb

## 2024-03-02 DIAGNOSIS — G629 Polyneuropathy, unspecified: Secondary | ICD-10-CM | POA: Diagnosis not present

## 2024-03-02 DIAGNOSIS — R200 Anesthesia of skin: Secondary | ICD-10-CM

## 2024-03-02 NOTE — Progress Notes (Signed)
 [facility] Clinic Note - Initial Visit   Date: 03/02/2024   Leonard Henry MRN: [id number] DOB: Nov 11, 1975   Dear Dr. Veto Kemps:  Thank you for your kind referral of Lakyn Mantione for consultation of numbness/tingling. Although his history is well known to you, please allow [place] to reiterate it for the purpose of our medical record. The patient was accompanied to the clinic by self.   Baltasar Twilley is a 49 y.o. right-handed male with vitamin B12 deficiency, hyperlipidemia, hypertension, and CAD presenting for evaluation of numbness/tingling.   IMPRESSION/PLAN: Bilateral feet numbness, most suggestive of neuropathy.  Risk factors includes vitamin B12 deficiency.  No history of diabetes.  Rare alcohol use.   Additional work-up to include:  - Check ESR, CRP, vitamin B12, folate, vitamin B1, copper, SPEP with IFE  - NCS/EMG of the legs  - Start vitamin B12 daily  Return to clinic in 4 months  ------------------------------------------------------------- History of present illness: Starting around 2018, he began having numbness involving the soles of the feet which constant.  He reports symptoms are worse when he is standing for long periods.  He has difficulty sensing the bottoms of the feet.  He denies weakness, imbalance, or falls.  He occasionally has numbness involving the hands, but only when he extends at the wrists.  PCP checked labs which showed vitamin B12 deficiency.  He has not been compliant with taking supplements.   He works in [employer].  He lives at home with wife and children.  He drinks 1-2 times per week, but stopped drinking over the past two months.    Out-side paper records, electronic medical record, and images have been reviewed where available and summarized as:  Lab Results  Component Value Date   HGBA1C 6.0 11/11/2023   Lab Results  Component Value Date   VITAMINB12 134 (L) 11/11/2023   Lab Results  Component Value Date   TSH  3.06 07/30/2023   Lab Results  Component Value Date   ESRSEDRATE 14 02/03/2019    Past Medical History:  Diagnosis Date   CAD (coronary artery disease)    Chest pain    Hyperlipidemia    Hypertension     Past Surgical History:  Procedure Laterality Date   LEFT HEART CATH AND CORONARY ANGIOGRAPHY N/A 08/22/2021   Procedure: LEFT HEART CATH AND CORONARY ANGIOGRAPHY;  Surgeon: Elder Negus, MD;  Location: [facility];  Service: Cardiovascular;  Laterality: N/A;     Medications:  Outpatient Encounter Medications as of 03/02/2024  Medication Sig   clobetasol cream (TEMOVATE) 0.05 % Apply 1 Application topically 2 (two) times daily.   diltiazem (CARDIZEM CD) 120 MG 24 hr capsule Take 1 capsule (120 mg total) by mouth daily.   mometasone (ELOCON) 0.1 % cream Apply to affected area once a day   nitroGLYCERIN (NITROSTAT) 0.4 MG SL tablet Place 1 tablet (0.4 mg total) under the tongue every 5 (five) minutes as needed for chest pain.   rosuvastatin (CRESTOR) 20 MG tablet Take 1 tablet (20 mg total) by mouth daily.   No facility-administered encounter medications on file as of 03/02/2024.    Allergies:  Allergies  Allergen Reactions   Iopamidol Itching and Nausea And Vomiting   Egg-Derived Products Nausea And Vomiting   Iodinated Contrast Media Other (See Comments)   Iodine Other (See Comments)   Other Itching    CAT SCAN DYE    Family History: Family History  Problem Relation Age of Onset  Hypertension Mother    Diabetes Mother    Heart disease Mother    Hyperlipidemia Mother    Diabetes Father    Hypertension Father    Heart disease Father    Colon cancer Neg Hx    Esophageal cancer Neg Hx    Rectal cancer Neg Hx    Stomach cancer Neg Hx     Social History: Social History   Tobacco Use   Smoking status: Never   Smokeless tobacco: Never  Vaping Use   Vaping status: Never Used  Substance Use Topics   Alcohol use: Yes    Comment: SOCIAL   Drug  use: No   Social History   Social History Narrative   Are you right handed or left handed? Right Handed    Are you currently employed ? No    What is your current occupation?   Do you live at home alone? No    Who lives with you? Family   What type of home do you live in: 1 story or 2 story? Lives in a two story home        Vital Signs:  BP 111/79   Pulse 91   Ht 5\' 10"  (1.778 m)   Wt 241 lb (109.3 kg)   SpO2 96%   BMI 34.58 kg/m   Neurological Exam: MENTAL STATUS including orientation to time, place, person, recent and remote memory, attention span and concentration, language, and fund of knowledge is normal.  Speech is not dysarthric.  CRANIAL NERVES: II:  No visual field defects.     III-IV-VI: Pupils equal round and reactive to light.  Normal conjugate, extra-ocular eye movements in all directions of gaze.  No nystagmus.  No ptosis.   V:  Normal facial sensation.    VII:  Normal facial symmetry and movements.   VIII:  Normal hearing and vestibular function.   IX-X:  Normal palatal movement.   XI:  Normal shoulder shrug and head rotation.   XII:  Normal tongue strength and range of motion, no deviation or fasciculation.  MOTOR:  No atrophy, fasciculations or abnormal movements.  No pronator drift.   Upper Extremity:  Right  Left  Deltoid  5/5   5/5   Biceps  5/5   5/5   Triceps  5/5   5/5   Wrist extensors  5/5   5/5   Wrist flexors  5/5   5/5   Finger extensors  5/5   5/5   Finger flexors  5/5   5/5   Dorsal interossei  5/5   5/5   Abductor pollicis  5/5   5/5   Tone (Ashworth scale)  0  0   Lower Extremity:  Right  Left  Hip flexors  5/5   5/5   Knee flexors  5/5   5/5   Knee extensors  5/5   5/5   Dorsiflexors  5/5   5/5   Plantarflexors  5/5   5/5   Toe extensors  5/5   5/5   Toe flexors  5/5   5/5   Tone (Ashworth scale)  0  0   MSRs:                                           Right        Left brachioradialis 2+  2+  biceps 2+  2+  triceps 2+   2+  patellar 2+  2+  ankle jerk 1+  1+  Hoffman no  no  plantar response down  down   SENSORY:  Vibration, temperature, and pin prick diminished below the ankles.  Rhomberg sign is absent.    COORDINATION/GAIT: Normal finger-to- nose-finger.  Intact rapid alternating movements bilaterally.   Gait narrow based and stable. Tandem and stressed gait intact.     Thank you for allowing me to participate in patient's care.  If I can answer any additional questions, I would be pleased to do so.    Sincerely,    Bowyn Mercier K. Allena Katz, DO

## 2024-03-02 NOTE — Patient Instructions (Signed)
 Check labs  Nerve testing of the legs  ELECTROMYOGRAM AND NERVE CONDUCTION STUDIES (EMG/NCS) INSTRUCTIONS  How to Prepare The neurologist conducting the EMG will need to know if you have certain medical conditions. Tell the neurologist and other EMG lab personnel if you: Have a pacemaker or any other electrical medical device Take blood-thinning medications Have hemophilia, a blood-clotting disorder that causes prolonged bleeding Bathing Take a shower or bath shortly before your exam in order to remove oils from your skin. Don't apply lotions or creams before the exam.  What to Expect You'll likely be asked to change into a hospital gown for the procedure and lie down on an examination table. The following explanations can help you understand what will happen during the exam.  Electrodes. The neurologist or a technician places surface electrodes at various locations on your skin depending on where you're experiencing symptoms. Or the neurologist may insert needle electrodes at different sites depending on your symptoms.  Sensations. The electrodes will at times transmit a tiny electrical current that you may feel as a twinge or spasm. The needle electrode may cause discomfort or pain that usually ends shortly after the needle is removed. If you are concerned about discomfort or pain, you may want to talk to the neurologist about taking a short break during the exam.  Instructions. During the needle EMG, the neurologist will assess whether there is any spontaneous electrical activity when the muscle is at rest - activity that isn't present in healthy muscle tissue - and the degree of activity when you slightly contract the muscle.  He or she will give you instructions on resting and contracting a muscle at appropriate times. Depending on what muscles and nerves the neurologist is examining, he or she may ask you to change positions during the exam.  After your EMG You may experience some  temporary, minor bruising where the needle electrode was inserted into your muscle. This bruising should fade within several days. If it persists, contact your primary care doctor.

## 2024-03-06 LAB — PROTEIN ELECTROPHORESIS, SERUM
Albumin ELP: 4.2 g/dL (ref 3.8–4.8)
Alpha 1: 0.3 g/dL (ref 0.2–0.3)
Alpha 2: 0.6 g/dL (ref 0.5–0.9)
Beta 2: 0.4 g/dL (ref 0.2–0.5)
Beta Globulin: 0.5 g/dL (ref 0.4–0.6)
Gamma Globulin: 0.9 g/dL (ref 0.8–1.7)
Total Protein: 6.8 g/dL (ref 6.1–8.1)

## 2024-03-06 LAB — B12 AND FOLATE PANEL
Folate: 9.8 ng/mL
Vitamin B-12: 264 pg/mL (ref 200–1100)

## 2024-03-06 LAB — SEDIMENTATION RATE: Sed Rate: 17 mm/h — ABNORMAL HIGH (ref 0–15)

## 2024-03-06 LAB — IMMUNOFIXATION ELECTROPHORESIS
IgG (Immunoglobin G), Serum: 1000 mg/dL (ref 600–1640)
IgM, Serum: 87 mg/dL (ref 50–300)
Immunoglobulin A: 147 mg/dL (ref 47–310)

## 2024-03-06 LAB — COPPER, SERUM: Copper: 97 ug/dL (ref 70–175)

## 2024-03-06 LAB — C-REACTIVE PROTEIN: CRP: 3.4 mg/L (ref <8.0)

## 2024-03-06 LAB — VITAMIN B1: Vitamin B1 (Thiamine): 10 nmol/L (ref 8–30)

## 2024-03-09 ENCOUNTER — Ambulatory Visit: Admitting: Family Medicine

## 2024-03-09 ENCOUNTER — Encounter: Payer: Self-pay | Admitting: Family Medicine

## 2024-03-09 VITALS — BP 120/74 | HR 83 | Temp 98.0°F | Ht 70.0 in | Wt 240.8 lb

## 2024-03-09 DIAGNOSIS — E538 Deficiency of other specified B group vitamins: Secondary | ICD-10-CM

## 2024-03-09 DIAGNOSIS — B354 Tinea corporis: Secondary | ICD-10-CM

## 2024-03-09 MED ORDER — VITAMIN B-12 1000 MCG PO TABS: 1000.0000 ug | ORAL_TABLET | Freq: Every day | ORAL | 3 refills | Status: DC

## 2024-03-09 MED ORDER — TERBINAFINE HCL 1 % EX CREA: 1.0000 | TOPICAL_CREAM | Freq: Two times a day (BID) | CUTANEOUS | 0 refills | Status: DC

## 2024-03-09 MED ORDER — CLOBETASOL PROPIONATE 0.05 % EX CREA: 1.0000 | TOPICAL_CREAM | Freq: Two times a day (BID) | CUTANEOUS | 0 refills | Status: DC

## 2024-03-09 NOTE — Progress Notes (Signed)
 [facility] [address] [place] [address] Dept: [phone] Dept Fax: [phone]  Chronic Care Office Visit  Subjective:    Patient ID: Leonard Henry, male    DOB: [birth year], 49 y.o..   MRN: [id number]  Chief Complaint  Patient presents with   Rash    C/o having a rash on both feet x 2 months. Has been using Mometasone cream.    History of Present Illness:  Patient is in today for reassessment of chronic medical issues.  Leonard Henry has had a persistent rash on his feet since late Sept. The rash has been pruritic. It initially involved some small patches around his toes with a limited amount on the dorsum. He notes that with time this has been progressive. He also now, as an area of rash on the right lower leg. He feels that wearing socks makes the rash worse. Previously, I felt this may be a dyshidrotic eczema. I prescribed a steroid cream and he felt this improved. However, now he feels the medication is not really helping.   At Leonard Henry physical this past summer, he had noted intermittent tingling and numbness in his hands and feet. His evaluation had shown Vitamin B12 deficiency. I had requested he receive Vitamin B12 weekly for 4 weeks, but he only ever came for a single injection. In Nov., he was continuing to complain of the numbness. I tried to restart the weekly B12 series. He cam for only 2 more injections. He saw Dr. Allena Katz (neurology) recently and she confirmed the likely cause of his neuropathy as a Vitamin B12 deficiency. She recommended he start takign an oral B12 supplement, which he has not done at this point.  Past Medical History: Patient Active Problem List   Diagnosis Date Noted   Lumbar degenerative disc disease 11/11/2023   Vitamin D insufficiency 11/11/2023   Peripheral polyneuropathy 11/11/2023   Prediabetes 11/11/2023   Vitamin B12 deficiency 07/30/2023   Coronary artery disease 12/20/2021    Bilateral primary osteoarthritis of knee 12/20/2021   Class 1 obesity 12/20/2021   Mixed hyperlipidemia 06/08/2021   Lateral epicondylitis of both elbows 02/05/2019   Plantar fasciitis, bilateral 04/22/2018   Past Surgical History:  Procedure Laterality Date   LEFT HEART CATH AND CORONARY ANGIOGRAPHY N/A 08/22/2021   Procedure: LEFT HEART CATH AND CORONARY ANGIOGRAPHY;  Surgeon: Elder Negus, MD;  Location: [facility];  Service: Cardiovascular;  Laterality: N/A;   Family History  Problem Relation Age of Onset   Hypertension Mother    Diabetes Mother    Heart disease Mother    Hyperlipidemia Mother    Diabetes Father    Hypertension Father    Heart disease Father    Colon cancer Neg Hx    Esophageal cancer Neg Hx    Rectal cancer Neg Hx    Stomach cancer Neg Hx    Outpatient Medications Prior to Visit  Medication Sig Dispense Refill   diltiazem (CARDIZEM CD) 120 MG 24 hr capsule Take 1 capsule (120 mg total) by mouth daily. 90 capsule 3   nitroGLYCERIN (NITROSTAT) 0.4 MG SL tablet Place 1 tablet (0.4 mg total) under the tongue every 5 (five) minutes as needed for chest pain. 30 tablet 3   rosuvastatin (CRESTOR) 20 MG tablet Take 1 tablet (20 mg total) by mouth daily. 90 tablet 3   mometasone (ELOCON) 0.1 % cream Apply to affected area once a day 45 g 1   clobetasol  cream (TEMOVATE) 0.05 % Apply 1 Application topically 2 (two) times daily. 30 g 0   No facility-administered medications prior to visit.   Allergies  Allergen Reactions   Iopamidol Itching and Nausea And Vomiting   Egg-Derived Products Nausea And Vomiting   Iodinated Contrast Media Other (See Comments)   Iodine Other (See Comments)   Other Itching    CAT SCAN DYE   Objective:   Today's Vitals   03/09/24 0959  BP: 120/74  Pulse: 83  Temp: 98 F (36.7 C)  TempSrc: Temporal  SpO2: 99%  Weight: 240 lb 12.8 oz (109.2 kg)  Height: 5\' 10"  (1.778 m)   Body mass index is 34.55 kg/m.    General: Well developed, well nourished. No acute distress. Skin: Extensive rash involving the dorsum of both feet with areas of raised, ropy erythema and scaliness, but also   some areas of hypopigmentation. There appears to be two coalescing lesions on the right posterior calf with a r  red, raised border, scaliness, but some central clearing.   Psych: Alert and oriented. Normal mood and affect.  Health Maintenance Due  Topic Date Due   Pneumococcal Vaccine 63-38 Years old (1 of 2 - PCV) Never done   Hepatitis C Screening  Never done   Lab Results:    Component Ref Range & Units (hover) 7 d ago (03/02/24) 3 mo ago (11/11/23) 7 mo ago (07/30/23)  Vitamin B-12 264 134 Low  R 125 Low  R   Assessment & Plan:   Problem List Items Addressed This Visit       Other   Vitamin B12 deficiency - Primary   Leonard Henry did not complete a course of B12 injections though we tried to do this on two occasions. His current level is in the lower end of the normal range now. I do recommend he start taking a daily Vitamin B12 supplement.      Relevant Medications   cyanocobalamin (VITAMIN B12) 1000 MCG tablet   Other Visit Diagnoses       Tinea corporis       The rash has soem characteristics of a tinea infection. I will ahve him start using terbinafine cream. I will continue a topical antiinflammatory as well.   Relevant Medications   terbinafine (LAMISIL) 1 % cream   clobetasol cream (TEMOVATE) 0.05 %       Return in about 2 weeks (around 03/23/2024) for Reassessment.   Loyola Mast, MD

## 2024-03-09 NOTE — Assessment & Plan Note (Signed)
 Leonard Henry did not complete a course of B12 injections though we tried to do this on two occasions. His current level is in the lower end of the normal range now. I do recommend he start taking a daily Vitamin B12 supplement.

## 2024-03-23 ENCOUNTER — Ambulatory Visit: Admitting: Family Medicine

## 2024-03-23 ENCOUNTER — Encounter: Payer: Self-pay | Admitting: Family Medicine

## 2024-03-23 DIAGNOSIS — B354 Tinea corporis: Secondary | ICD-10-CM

## 2024-03-23 MED ORDER — CLOBETASOL PROPIONATE 0.05 % EX CREA: 1.0000 | TOPICAL_CREAM | Freq: Two times a day (BID) | CUTANEOUS | 0 refills | Status: DC

## 2024-03-23 MED ORDER — TERBINAFINE HCL 1 % EX CREA: 1.0000 | TOPICAL_CREAM | Freq: Two times a day (BID) | CUTANEOUS | 0 refills | Status: DC

## 2024-03-23 NOTE — Progress Notes (Signed)
 [facility] [address] [place] [address] Dept: [phone] Dept Fax: [phone]  Office Visit  Subjective:    Patient ID: Leonard Henry, male    DOB: [birth year], 49 y.o..   MRN: [id number]  Chief Complaint  Patient presents with   Follow-up    2 week f/u toenails.     History of Present Illness:  Patient is in today for reassessment of a persistent rash on his feet and right lower leg. This had the appearance of tinea corporis and tinea pedis. I treated whim with terbinafine cream and clobetasol. He has noted improvement int he rash.  Past Medical History: Patient Active Problem List   Diagnosis Date Noted   Lumbar degenerative disc disease 11/11/2023   Vitamin D insufficiency 11/11/2023   Peripheral polyneuropathy 11/11/2023   Prediabetes 11/11/2023   Vitamin B12 deficiency 07/30/2023   Coronary artery disease 12/20/2021   Bilateral primary osteoarthritis of knee 12/20/2021   Class 1 obesity 12/20/2021   Mixed hyperlipidemia 06/08/2021   Lateral epicondylitis of both elbows 02/05/2019   Plantar fasciitis, bilateral 04/22/2018   Past Surgical History:  Procedure Laterality Date   LEFT HEART CATH AND CORONARY ANGIOGRAPHY N/A 08/22/2021   Procedure: LEFT HEART CATH AND CORONARY ANGIOGRAPHY;  Surgeon: Elder Negus, MD;  Location: [facility];  Service: Cardiovascular;  Laterality: N/A;   Family History  Problem Relation Age of Onset   Hypertension Mother    Diabetes Mother    Heart disease Mother    Hyperlipidemia Mother    Diabetes Father    Hypertension Father    Heart disease Father    Colon cancer Neg Hx    Esophageal cancer Neg Hx    Rectal cancer Neg Hx    Stomach cancer Neg Hx    Outpatient Medications Prior to Visit  Medication Sig Dispense Refill   cyanocobalamin (VITAMIN B12) 1000 MCG tablet Take 1 tablet (1,000 mcg total) by mouth daily. 90 tablet 3   diltiazem (CARDIZEM CD) 120  MG 24 hr capsule Take 1 capsule (120 mg total) by mouth daily. 90 capsule 3   nitroGLYCERIN (NITROSTAT) 0.4 MG SL tablet Place 1 tablet (0.4 mg total) under the tongue every 5 (five) minutes as needed for chest pain. 30 tablet 3   rosuvastatin (CRESTOR) 20 MG tablet Take 1 tablet (20 mg total) by mouth daily. 90 tablet 3   clobetasol cream (TEMOVATE) 0.05 % Apply 1 Application topically 2 (two) times daily. 30 g 0   terbinafine (LAMISIL) 1 % cream Apply 1 Application topically 2 (two) times daily. 30 g 0   No facility-administered medications prior to visit.   Allergies  Allergen Reactions   Iopamidol Itching and Nausea And Vomiting   Egg-Derived Products Nausea And Vomiting   Iodinated Contrast Media Other (See Comments)   Iodine Other (See Comments)   Other Itching    CAT SCAN DYE     Objective:   Today's Vitals   03/23/24 0800  BP: 112/70  Pulse: 62  Temp: 97.7 F (36.5 C)  TempSrc: Temporal  SpO2: 98%  Weight: 241 lb 12.8 oz (109.7 kg)  Height: 5\' 10"  (1.778 m)   Body mass index is 34.69 kg/m.   General: Well developed, well nourished. No acute distress. Skin: Warm and dry. Marked clearing of the rash on both feet and the larger patch on the right calf. There is some mottling of the   coloration on the feet, but  no persistent scaliness or erythema. Psych: Alert and oriented. Normal mood and affect.  Health Maintenance Due  Topic Date Due   Pneumococcal Vaccine 67-48 Years old (1 of 2 - PCV) Never done   Hepatitis C Screening  Never done     Assessment & Plan:   Problem List Items Addressed This Visit   None Visit Diagnoses       Tinea corporis       Much improved. I will renew the clobetasol and terbinafine to help complete clearing of his rash and to have on hand if recurrence occurs.   Relevant Medications   clobetasol cream (TEMOVATE) 0.05 %   terbinafine (LAMISIL) 1 % cream       Return in about 6 months (around 09/23/2024) for Reassessment.    Loyola Mast, MD

## 2024-03-26 ENCOUNTER — Encounter: Admitting: Neurology

## 2024-03-26 ENCOUNTER — Encounter: Payer: Self-pay | Admitting: Neurology

## 2024-04-07 ENCOUNTER — Other Ambulatory Visit: Payer: Self-pay | Admitting: Cardiology

## 2024-04-07 DIAGNOSIS — I2510 Atherosclerotic heart disease of native coronary artery without angina pectoris: Secondary | ICD-10-CM

## 2024-05-19 ENCOUNTER — Other Ambulatory Visit: Payer: Self-pay | Admitting: Cardiology

## 2024-05-19 DIAGNOSIS — I2510 Atherosclerotic heart disease of native coronary artery without angina pectoris: Secondary | ICD-10-CM

## 2024-05-26 ENCOUNTER — Other Ambulatory Visit: Payer: Self-pay

## 2024-05-26 DIAGNOSIS — I2510 Atherosclerotic heart disease of native coronary artery without angina pectoris: Secondary | ICD-10-CM

## 2024-05-26 MED ORDER — ROSUVASTATIN CALCIUM 20 MG PO TABS: 20.0000 mg | ORAL_TABLET | Freq: Every day | ORAL | 0 refills | Status: DC

## 2024-06-11 ENCOUNTER — Other Ambulatory Visit: Payer: Self-pay | Admitting: Cardiology

## 2024-06-11 DIAGNOSIS — I2510 Atherosclerotic heart disease of native coronary artery without angina pectoris: Secondary | ICD-10-CM

## 2024-07-07 ENCOUNTER — Encounter: Payer: Self-pay | Admitting: Neurology

## 2024-07-07 ENCOUNTER — Ambulatory Visit: Admitting: Neurology

## 2024-07-07 DIAGNOSIS — Z029 Encounter for administrative examinations, unspecified: Secondary | ICD-10-CM

## 2024-07-10 ENCOUNTER — Telehealth: Payer: Self-pay | Admitting: Neurology

## 2024-07-10 NOTE — Telephone Encounter (Signed)
 We are writing to inform you that [facility], including all providers within this practice, will no longer to able to provide medical care to yo.  This decision is the result of repeated missed appointments without adequate notice, which has disrupted our ability to provide timely and effective care to all patients. 07/07/24

## 2024-07-13 ENCOUNTER — Ambulatory Visit: Payer: Self-pay | Admitting: Family Medicine

## 2024-07-13 ENCOUNTER — Encounter: Payer: Self-pay | Admitting: Family Medicine

## 2024-07-13 ENCOUNTER — Other Ambulatory Visit: Payer: Self-pay | Admitting: Family Medicine

## 2024-07-13 ENCOUNTER — Ambulatory Visit: Admitting: Family Medicine

## 2024-07-13 VITALS — BP 116/72 | HR 80 | Temp 97.6°F | Ht 70.0 in | Wt 240.4 lb

## 2024-07-13 DIAGNOSIS — E538 Deficiency of other specified B group vitamins: Secondary | ICD-10-CM | POA: Diagnosis not present

## 2024-07-13 DIAGNOSIS — I2510 Atherosclerotic heart disease of native coronary artery without angina pectoris: Secondary | ICD-10-CM

## 2024-07-13 DIAGNOSIS — Z6834 Body mass index (BMI) 34.0-34.9, adult: Secondary | ICD-10-CM

## 2024-07-13 DIAGNOSIS — E66811 Obesity, class 1: Secondary | ICD-10-CM

## 2024-07-13 DIAGNOSIS — E6609 Other obesity due to excess calories: Secondary | ICD-10-CM | POA: Diagnosis not present

## 2024-07-13 DIAGNOSIS — E782 Mixed hyperlipidemia: Secondary | ICD-10-CM | POA: Diagnosis not present

## 2024-07-13 DIAGNOSIS — Z1159 Encounter for screening for other viral diseases: Secondary | ICD-10-CM

## 2024-07-13 DIAGNOSIS — R7303 Prediabetes: Secondary | ICD-10-CM

## 2024-07-13 LAB — BASIC METABOLIC PANEL WITH GFR
BUN: 13 mg/dL (ref 6–23)
CO2: 24 meq/L (ref 19–32)
Calcium: 9.8 mg/dL (ref 8.4–10.5)
Chloride: 105 meq/L (ref 96–112)
Creatinine, Ser: 0.88 mg/dL (ref 0.40–1.50)
GFR: 101.67 mL/min (ref >60.00)
Glucose, Bld: 106 mg/dL — ABNORMAL HIGH (ref 70–99)
Potassium: 4 meq/L (ref 3.5–5.1)
Sodium: 138 meq/L (ref 135–145)

## 2024-07-13 LAB — LIPID PANEL
Cholesterol: 154 mg/dL (ref 0–200)
HDL: 41.7 mg/dL (ref >39.00)
LDL Cholesterol: 86 mg/dL (ref 0–99)
NonHDL: 112.61
Total CHOL/HDL Ratio: 4
Triglycerides: 133 mg/dL (ref 0.0–149.0)
VLDL: 26.6 mg/dL (ref 0.0–40.0)

## 2024-07-13 LAB — HEMOGLOBIN A1C: Hgb A1c MFr Bld: 6 % (ref 4.6–6.5)

## 2024-07-13 LAB — VITAMIN B12: Vitamin B-12: 210 pg/mL — ABNORMAL LOW (ref 211–911)

## 2024-07-13 MED ORDER — ROSUVASTATIN CALCIUM 20 MG PO TABS: 20.0000 mg | ORAL_TABLET | Freq: Every day | ORAL | 3 refills | Status: DC

## 2024-07-13 MED ORDER — ROSUVASTATIN CALCIUM 40 MG PO TABS: 40.0000 mg | ORAL_TABLET | Freq: Every day | ORAL | 3 refills | Status: AC

## 2024-07-13 MED ORDER — DILTIAZEM HCL ER COATED BEADS 120 MG PO CP24
120.0000 mg | ORAL_CAPSULE | Freq: Every day | ORAL | 3 refills | Status: DC
Start: 2024-07-13 — End: 2024-07-14

## 2024-07-13 NOTE — Telephone Encounter (Signed)
 Copied from CRM [id number]. Topic: Clinical - Medication Refill >> Jul 13, 2024  3:07 PM Deleta S wrote: Medication: diltiazem  120 mg  Has the patient contacted their pharmacy? Yes (Agent: If no, request that the patient contact the pharmacy for the refill. If patient does not wish to contact the pharmacy document the reason why and proceed with request.) (Agent: If yes, when and what did the pharmacy advise?)  This is the patient's preferred pharmacy:  [facility] [address] [place] - [address] [place] [address] Phone: [phone] Fax: [phone]  Is this the correct pharmacy for this prescription? Yes If no, delete pharmacy and type the correct one.   Has the prescription been filled recently? Yes  Is the patient out of the medication? Yes  Has the patient been seen for an appointment in the last year OR does the patient have an upcoming appointment? Yes  Can we respond through MyChart? Yes  Agent: Please be advised that Rx refills may take up to 3 business days. We ask that you follow-up with your pharmacy.

## 2024-07-13 NOTE — Telephone Encounter (Unsigned)
 Copied from CRM [id number]. Topic: Clinical - Medication Refill >> Jul 13, 2024  3:07 PM Deleta S wrote: Medication: diltiazem  120 mg  Has the patient contacted their pharmacy? Yes (Agent: If no, request that the patient contact the pharmacy for the refill. If patient does not wish to contact the pharmacy document the reason why and proceed with request.) (Agent: If yes, when and what did the pharmacy advise?)  This is the patient's preferred pharmacy:  [facility] [address] [place] - [address] [place] [address] Phone: [phone] Fax: [phone]  Is this the correct pharmacy for this prescription? Yes If no, delete pharmacy and type the correct one.   Has the prescription been filled recently? Yes  Is the patient out of the medication? Yes  Has the patient been seen for an appointment in the last year OR does the patient have an upcoming appointment? Yes  Can we respond through MyChart? Yes  Agent: Please be advised that Rx refills may take up to 3 business days. We ask that you follow-up with your pharmacy.

## 2024-07-13 NOTE — Assessment & Plan Note (Signed)
 We will check annual lipids. Continue rosuvastatin  20 mg daily.

## 2024-07-13 NOTE — Telephone Encounter (Signed)
 Diltiazem  prescription refilled today, duplicate requesting. Closing encounter.

## 2024-07-13 NOTE — Progress Notes (Signed)
 [facility] [address] [place] [address] Dept: [phone] Dept Fax: [phone]  Chronic Care Office Visit  Subjective:    Patient ID: Leonard Henry, male    DOB: 1975-12-03, 49 y.o..   MRN: [id number]  Chief Complaint  Patient presents with   Follow-up    3 month f/u meds.  No concerns.  Fasting today.    History of Present Illness:  Patient is in today for reassessment of chronic medical issues.  Leonard Henry has a history of coronary artery disease.  He is being managed with diltiazem  120 mg daily and rosuvastatin  20 mg daily. Cardiology did not feel he needed to take a daily aspirin , as his prior cath showed only a 30% stenosis of the LAD ostium. Leonard Henry notes occasional tightness of the chest which occurs at rest. He continues to work at dietary changes to lose weight. He is friends with Dr. Luwana Sor, at [facility]. He notes Dr. Sor is providing him with a weight loss drug for weekly injection. For the past month, he has been on a 0.25 mg dose. He ahs had some nausea and admits to constipation. He is disappointed that he has not had weight loss so far. The plan is to increase the dose to 0.5 mg weekly starting next week.  Leonard Henry has a history of peripheral neuropathy that appears to be secondary to vitamin B12 deficiency. He is taking a daily vitamin B12 supplement. He is unsure if his numbness has improved.  Past Medical History: Patient Active Problem List   Diagnosis Date Noted   Lumbar degenerative disc disease 11/11/2023   Vitamin D  insufficiency 11/11/2023   Peripheral polyneuropathy 11/11/2023   Prediabetes 11/11/2023   Vitamin B12 deficiency 07/30/2023   Coronary artery disease 12/20/2021   Bilateral primary osteoarthritis of knee 12/20/2021   Class 1 obesity due to excess calories with body mass index (BMI) of 34.0 to 34.9 in adult 12/20/2021   Mixed hyperlipidemia 06/08/2021   Lateral  epicondylitis of both elbows 02/05/2019   Plantar fasciitis, bilateral 04/22/2018   Past Surgical History:  Procedure Laterality Date   LEFT HEART CATH AND CORONARY ANGIOGRAPHY N/A 08/22/2021   Procedure: LEFT HEART CATH AND CORONARY ANGIOGRAPHY;  Surgeon: Elmira Newman PARAS, MD;  Location: [facility] INVASIVE CV LAB;  Service: Cardiovascular;  Laterality: N/A;   Family History  Problem Relation Age of Onset   Hypertension Mother    Diabetes Mother    Heart disease Mother    Hyperlipidemia Mother    Diabetes Father    Hypertension Father    Heart disease Father    Colon cancer Neg Hx    Esophageal cancer Neg Hx    Rectal cancer Neg Hx    Stomach cancer Neg Hx    Outpatient Medications Prior to Visit  Medication Sig Dispense Refill   cyanocobalamin  (VITAMIN B12) 1000 MCG tablet Take 1 tablet (1,000 mcg total) by mouth daily. 90 tablet 3   nitroGLYCERIN  (NITROSTAT ) 0.4 MG SL tablet Place 1 tablet (0.4 mg total) under the tongue every 5 (five) minutes as needed for chest pain. 30 tablet 3   clobetasol  cream (TEMOVATE ) 0.05 % Apply 1 Application topically 2 (two) times daily. 30 g 0   diltiazem  (CARDIZEM  CD) 120 MG 24 hr capsule Take 1 capsule by mouth once daily 90 capsule 0   rosuvastatin  (CRESTOR ) 20 MG tablet Take 1 tablet (20 mg total) by mouth daily. 15 tablet 0  terbinafine  (LAMISIL ) 1 % cream Apply 1 Application topically 2 (two) times daily. 30 g 0   No facility-administered medications prior to visit.   Allergies  Allergen Reactions   Iopamidol  Itching and Nausea And Vomiting   Egg-Derived Products Nausea And Vomiting   Iodinated Contrast Media Other (See Comments)   Iodine Other (See Comments)   Other Itching    CAT SCAN DYE   Objective:   Today's Vitals   07/13/24 0807  BP: 116/72  Pulse: 80  Temp: 97.6 F (36.4 C)  TempSrc: Temporal  SpO2: 97%  Weight: 240 lb 6.4 oz (109 kg)  Height: 5' 10 (1.778 m)   Body mass index is 34.49 kg/m.   General: Well  developed, well nourished. No acute distress. CV: RRR without murmurs or rubs. Pulses 2+ bilaterally. Psych: Alert and oriented. Normal mood and affect.  Health Maintenance Due  Topic Date Due   Hepatitis C Screening  Never done   Pneumococcal Vaccine 13-48 Years old (1 of 2 - PCV) Never done   Hepatitis B Vaccines (1 of 3 - 19+ 3-dose series) Never done     Assessment & Plan:   Problem List Items Addressed This Visit       Cardiovascular and Mediastinum   Coronary artery disease   Leonard Henry has been having some chest tightness at times, occurring at rest. Continue diltiazem  120 mg daily. I recommend he contact Dr. Gilda office to schedule a follow-up regarding his CAD.      Relevant Medications   diltiazem  (CARDIZEM  CD) 120 MG 24 hr capsule   rosuvastatin  (CRESTOR ) 20 MG tablet     Other   Class 1 obesity due to excess calories with body mass index (BMI) of 34.0 to 34.9 in adult   Leonard Henry apparently is being provided a compounded semaglutide at a 0.25 mg weekly dose. I recommend he add a daily fiber supplement to help offset constipation. I will help him monitor his response.      Relevant Orders   Basic metabolic panel with GFR   Hemoglobin A1c   Mixed hyperlipidemia   We will check annual lipids. Continue rosuvastatin  20 mg daily.      Relevant Medications   diltiazem  (CARDIZEM  CD) 120 MG 24 hr capsule   rosuvastatin  (CRESTOR ) 20 MG tablet   Other Relevant Orders   Lipid panel   Prediabetes   I will repeat a blood sugar and check an A1c.      Relevant Orders   Basic metabolic panel with GFR   Hemoglobin A1c   Vitamin B12 deficiency - Primary   I will reassess his vitamin B12 level today. Continue daily vitamin B12 supplement.      Relevant Orders   Vitamin B12   Other Visit Diagnoses       Encounter for hepatitis C screening test for low risk patient       Relevant Orders   HCV Ab w Reflex to Quant PCR       Return in about 6 months (around  01/13/2025) for Reassessment.   Garnette CHRISTELLA Simpler, MD

## 2024-07-13 NOTE — Assessment & Plan Note (Signed)
 Leonard Henry apparently is being provided a compounded semaglutide at a 0.25 mg weekly dose. I recommend he add a daily fiber supplement to help offset constipation. I will help him monitor his response.

## 2024-07-13 NOTE — Assessment & Plan Note (Signed)
 Mr. Burdin has been having some chest tightness at times, occurring at rest. Continue diltiazem  120 mg daily. I recommend he contact Dr. Gilda office to schedule a follow-up regarding his CAD.

## 2024-07-13 NOTE — Assessment & Plan Note (Signed)
 I will repeat a blood sugar and check an A1c.

## 2024-07-13 NOTE — Assessment & Plan Note (Signed)
 I will reassess his vitamin B12 level today. Continue daily vitamin B12 supplement.

## 2024-07-14 LAB — HCV AB W REFLEX TO QUANT PCR: HCV Ab: NONREACTIVE

## 2024-07-14 LAB — HCV INTERPRETATION

## 2024-07-14 MED ORDER — DILTIAZEM HCL ER COATED BEADS 120 MG PO CP24: 120.0000 mg | ORAL_CAPSULE | Freq: Every day | ORAL | 3 refills | Status: AC

## 2024-07-14 MED ORDER — CYANOCOBALAMIN 1000 MCG/ML IJ SOLN: 1000.0000 ug | INTRAMUSCULAR | 3 refills | Status: AC

## 2024-07-22 ENCOUNTER — Ambulatory Visit: Admitting: Family Medicine

## 2024-07-22 DIAGNOSIS — E538 Deficiency of other specified B group vitamins: Secondary | ICD-10-CM

## 2024-07-23 MED ORDER — CYANOCOBALAMIN 1000 MCG/ML IJ SOLN
1000.0000 ug | INTRAMUSCULAR | Status: AC
  Administered 2024-07-22: 1000 ug via INTRAMUSCULAR

## 2024-07-23 NOTE — Progress Notes (Signed)
 Patient presented to have nursing assist with doing his B12 injections. He picked the medication vials up from his pharmacy, but they were unable to provide him with needles and syringes.  1. Vitamin B12 deficiency (Primary)  - cyanocobalamin  (VITAMIN B12) injection 1,000 mcg   Garnette EMERSON Simpler, MD

## 2024-07-23 NOTE — Progress Notes (Signed)
 Pt will provide B12. Dr Thedora need to update orders for office to administer monthly. Pt educated how to give  IM injection to himself he was instructed by myself and Lauren Mcelwee in office.

## 2024-08-20 ENCOUNTER — Other Ambulatory Visit: Payer: Self-pay

## 2024-08-20 ENCOUNTER — Emergency Department (HOSPITAL_COMMUNITY)
Admission: EM | Admit: 2024-08-20 | Discharge: 2024-08-20 | Disposition: A | Discharge status: Home / Self Care | Attending: Emergency Medicine | Admitting: Emergency Medicine

## 2024-08-20 ENCOUNTER — Emergency Department (HOSPITAL_COMMUNITY)

## 2024-08-20 ENCOUNTER — Encounter (HOSPITAL_COMMUNITY): Payer: Self-pay | Admitting: Emergency Medicine

## 2024-08-20 DIAGNOSIS — R109 Unspecified abdominal pain: Secondary | ICD-10-CM | POA: Diagnosis present

## 2024-08-20 DIAGNOSIS — N132 Hydronephrosis with renal and ureteral calculous obstruction: Secondary | ICD-10-CM | POA: Insufficient documentation

## 2024-08-20 DIAGNOSIS — N200 Calculus of kidney: Secondary | ICD-10-CM

## 2024-08-20 DIAGNOSIS — I10 Essential (primary) hypertension: Secondary | ICD-10-CM | POA: Insufficient documentation

## 2024-08-20 DIAGNOSIS — Z79899 Other long term (current) drug therapy: Secondary | ICD-10-CM | POA: Insufficient documentation

## 2024-08-20 LAB — CBC WITH DIFFERENTIAL/PLATELET
Abs Immature Granulocytes: 0.04 K/uL (ref 0.00–0.07)
Basophils Absolute: 0 K/uL (ref 0.0–0.1)
Basophils Relative: 0 %
Eosinophils Absolute: 0.1 K/uL (ref 0.0–0.5)
Eosinophils Relative: 1 %
HCT: 40.8 % (ref 39.0–52.0)
Hemoglobin: 14 g/dL (ref 13.0–17.0)
Immature Granulocytes: 0 %
Lymphocytes Relative: 17 %
Lymphs Abs: 2 K/uL (ref 0.7–4.0)
MCH: 28.8 pg (ref 26.0–34.0)
MCHC: 34.3 g/dL (ref 30.0–36.0)
MCV: 84 fL (ref 80.0–100.0)
Monocytes Absolute: 0.9 K/uL (ref 0.1–1.0)
Monocytes Relative: 8 %
Neutro Abs: 8.8 K/uL — ABNORMAL HIGH (ref 1.7–7.7)
Neutrophils Relative %: 74 %
Platelets: 234 K/uL (ref 150–400)
RBC: 4.86 MIL/uL (ref 4.22–5.81)
RDW: 13.8 % (ref 11.5–15.5)
WBC: 11.9 K/uL — ABNORMAL HIGH (ref 4.0–10.5)
nRBC: 0 % (ref 0.0–0.2)

## 2024-08-20 LAB — URINALYSIS, ROUTINE W REFLEX MICROSCOPIC
Bilirubin Urine: NEGATIVE
Glucose, UA: NEGATIVE mg/dL
Ketones, ur: NEGATIVE mg/dL
Leukocytes,Ua: NEGATIVE
Nitrite: NEGATIVE
Protein, ur: 100 mg/dL — AB
Specific Gravity, Urine: 1.029 (ref 1.005–1.030)
pH: 5 (ref 5.0–8.0)

## 2024-08-20 LAB — BASIC METABOLIC PANEL WITH GFR
Anion gap: 7 (ref 5–15)
BUN: 12 mg/dL (ref 6–20)
CO2: 21 mmol/L — ABNORMAL LOW (ref 22–32)
Calcium: 8.5 mg/dL — ABNORMAL LOW (ref 8.9–10.3)
Chloride: 109 mmol/L (ref 98–111)
Creatinine, Ser: 0.84 mg/dL (ref 0.61–1.24)
GFR, Estimated: >60 mL/min (ref >60)
Glucose, Bld: 106 mg/dL — ABNORMAL HIGH (ref 70–99)
Potassium: 3.6 mmol/L (ref 3.5–5.1)
Sodium: 137 mmol/L (ref 135–145)

## 2024-08-20 MED ORDER — LACTATED RINGERS IV BOLUS
1000.0000 mL | Freq: Once | INTRAVENOUS | Status: AC
  Administered 2024-08-20: 1000 mL via INTRAVENOUS

## 2024-08-20 MED ORDER — OXYCODONE HCL 5 MG PO TABS
5.0000 mg | ORAL_TABLET | Freq: Four times a day (QID) | ORAL | 0 refills | Status: DC | PRN
Start: 2024-08-20 — End: 2024-08-20

## 2024-08-20 MED ORDER — OXYCODONE HCL 5 MG PO TABS
5.0000 mg | ORAL_TABLET | Freq: Four times a day (QID) | ORAL | 0 refills | Status: DC | PRN
Start: 2024-08-20 — End: 2024-10-27

## 2024-08-20 MED ORDER — MORPHINE SULFATE (PF) 4 MG/ML IV SOLN
4.0000 mg | Freq: Once | INTRAVENOUS | Status: AC
  Administered 2024-08-20: 4 mg via INTRAVENOUS
  Filled 2024-08-20: qty 1

## 2024-08-20 MED ORDER — ONDANSETRON 4 MG PO TBDP
4.0000 mg | ORAL_TABLET | Freq: Once | ORAL | Status: AC
  Administered 2024-08-20: 4 mg via ORAL

## 2024-08-20 MED ORDER — TAMSULOSIN HCL 0.4 MG PO CAPS: 0.4000 mg | ORAL_CAPSULE | Freq: Every day | ORAL | 0 refills | Status: DC

## 2024-08-20 MED ORDER — ONDANSETRON HCL 4 MG/2ML IJ SOLN
4.0000 mg | Freq: Once | INTRAMUSCULAR | Status: AC
  Administered 2024-08-20: 4 mg via INTRAVENOUS
  Filled 2024-08-20: qty 2

## 2024-08-20 MED ORDER — ONDANSETRON HCL 4 MG PO TABS: 4.0000 mg | ORAL_TABLET | Freq: Three times a day (TID) | ORAL | 0 refills | Status: AC | PRN

## 2024-08-20 MED ORDER — KETOROLAC TROMETHAMINE 15 MG/ML IJ SOLN
15.0000 mg | Freq: Once | INTRAMUSCULAR | Status: AC
  Administered 2024-08-20: 15 mg via INTRAVENOUS
  Filled 2024-08-20: qty 1

## 2024-08-20 MED ORDER — OXYCODONE HCL 5 MG PO TABS: 5.0000 mg | ORAL_TABLET | Freq: Four times a day (QID) | ORAL | 0 refills | Status: DC | PRN

## 2024-08-20 MED ORDER — ONDANSETRON 4 MG PO TBDP
ORAL_TABLET | ORAL | Status: AC
  Filled 2024-08-20: qty 1

## 2024-08-20 MED ORDER — ONDANSETRON HCL 4 MG PO TABS: 4.0000 mg | ORAL_TABLET | Freq: Three times a day (TID) | ORAL | 0 refills | Status: DC | PRN

## 2024-08-20 NOTE — ED Notes (Signed)
 Pt got up for discharge and started vomiting, MD made aware and zofran  given

## 2024-08-20 NOTE — ED Triage Notes (Signed)
 Pt coming in for flank pain. Pt has history of kidney stones. Pt also having some nausea. Pt given 100 mcg of fentanyl  by EMS

## 2024-08-20 NOTE — ED Notes (Signed)
 Patient transported to CT

## 2024-08-20 NOTE — ED Notes (Signed)
 Pt discharged now in NAD

## 2024-08-20 NOTE — ED Provider Notes (Signed)
 [place] [facility] Provider Note   CSN: [id number] Arrival date & time: 08/20/24  8073     Patient presents with: Flank Pain   Leonard Henry is a 49 y.o. male past medical history significant for hypertension, hyperlipidemia, nephrolithiasis status post acute intervention who presents emergency department for left flank and abdominal pain.  Patient states that his abdominal pain is similar to prior kidney stones in the past.  Patient states that he has experienced this pain x 1 to 2 days that is acutely worsening.  Patient denies associated nausea, vomiting, dysuria, hematuria.  Patient denies associated fever    Flank Pain       Prior to Admission medications   Medication Sig Start Date End Date Taking? Authorizing Provider  cyanocobalamin  (VITAMIN B12) 1000 MCG/ML injection Inject 1 mL (1,000 mcg total) into the muscle every 30 (thirty) days. 07/14/24   Thedora Garnette HERO, MD  diltiazem  (CARDIZEM  CD) 120 MG 24 hr capsule Take 1 capsule (120 mg total) by mouth daily. 07/14/24   Thedora Garnette HERO, MD  nitroGLYCERIN  (NITROSTAT ) 0.4 MG SL tablet Place 1 tablet (0.4 mg total) under the tongue every 5 (five) minutes as needed for chest pain. 07/30/23 07/13/24  Thedora Garnette HERO, MD  ondansetron  (ZOFRAN ) 4 MG tablet Take 1 tablet (4 mg total) by mouth every 8 (eight) hours as needed for up to 5 days for nausea or vomiting. 08/20/24 08/25/24  Nada Chroman, DO  oxyCODONE  (ROXICODONE ) 5 MG immediate release tablet Take 1 tablet (5 mg total) by mouth every 6 (six) hours as needed. 08/20/24   Dreama Longs, MD  rosuvastatin  (CRESTOR ) 40 MG tablet Take 1 tablet (40 mg total) by mouth daily. 07/13/24   Thedora Garnette HERO, MD  tamsulosin  (FLOMAX ) 0.4 MG CAPS capsule Take 1 capsule (0.4 mg total) by mouth daily for 14 days. 08/20/24 09/03/24  Nada Chroman, DO    Allergies: Iopamidol , Egg-derived products, Iodinated contrast media, Iodine, and Other    Review of Systems   Genitourinary:  Positive for flank pain.    Updated Vital Signs BP 114/84   Pulse (!) 50   Temp 97.8 F (36.6 C) (Oral)   Resp 16   SpO2 94%   Physical Exam Vitals reviewed.  Constitutional:      General: He is in acute distress.  Cardiovascular:     Rate and Rhythm: Normal rate.  Pulmonary:     Effort: Pulmonary effort is normal. No tachypnea.  Abdominal:     General: There is no distension.     Palpations: Abdomen is soft.     Tenderness: There is abdominal tenderness in the left lower quadrant. There is left CVA tenderness. There is no guarding or rebound.  Skin:    General: Skin is warm.     Capillary Refill: Capillary refill takes less than 2 seconds.     Findings: No rash.  Neurological:     Mental Status: He is alert.     (all labs ordered are listed, but only abnormal results are displayed) Labs Reviewed  URINALYSIS, ROUTINE W REFLEX MICROSCOPIC - Abnormal; Notable for the following components:      Result Value   Color, Urine AMBER (*)    APPearance CLOUDY (*)    Hgb urine dipstick LARGE (*)    Protein, ur 100 (*)    Bacteria, UA RARE (*)    All other components within normal limits  CBC WITH DIFFERENTIAL/PLATELET - Abnormal; Notable for the  following components:   WBC 11.9 (*)    Neutro Abs 8.8 (*)    All other components within normal limits  BASIC METABOLIC PANEL WITH GFR - Abnormal; Notable for the following components:   CO2 21 (*)    Glucose, Bld 106 (*)    Calcium  8.5 (*)    All other components within normal limits  URINE CULTURE    EKG: None  Radiology: CT Renal Stone Study Result Date: 08/20/2024 CLINICAL DATA:  Abdominal pain, flank pain EXAM: CT ABDOMEN AND PELVIS WITHOUT CONTRAST TECHNIQUE: Multidetector CT imaging of the abdomen and pelvis was performed following the standard protocol without IV contrast. RADIATION DOSE REDUCTION: This exam was performed according to the departmental dose-optimization program which includes automated  exposure control, adjustment of the mA and/or kV according to patient size and/or use of iterative reconstruction technique. COMPARISON:  05/07/2019 FINDINGS: Lower chest: Dependent atelectasis in the lower lobes. No effusions. Hepatobiliary: No focal hepatic abnormality. Gallbladder unremarkable. Pancreas: No focal abnormality or ductal dilatation. Spleen: No focal abnormality.  Normal size. Adrenals/Urinary Tract: Adrenal glands normal. Mild left hydronephrosis due to 3 mm proximal left ureteral stone. Punctate nonobstructing stone in the upper pole of the left kidney. No stones or hydronephrosis on the right. Adrenal glands and urinary bladder unremarkable. Stomach/Bowel: Normal appendix. Stomach, large and small bowel grossly unremarkable. Vascular/Lymphatic: No evidence of aneurysm or adenopathy. Reproductive: No visible focal abnormality. Other: No free fluid or free air. Musculoskeletal: No acute bony abnormality. IMPRESSION: 3 mm proximal left ureteral stone with mild left hydronephrosis. Punctate left upper pole nephrolithiasis. Electronically Signed   By: Franky Crease M.D.   On: 08/20/2024 20:05     Procedures   Medications Ordered in the ED  ondansetron  (ZOFRAN -ODT) 4 MG disintegrating tablet (has no administration in time range)  lactated ringers  bolus 1,000 mL (0 mLs Intravenous Stopped 08/20/24 2039)  morphine  (PF) 4 MG/ML injection 4 mg (4 mg Intravenous Given 08/20/24 1950)  morphine  (PF) 4 MG/ML injection 4 mg (4 mg Intravenous Given 08/20/24 2043)  ketorolac  (TORADOL ) 15 MG/ML injection 15 mg (15 mg Intravenous Given 08/20/24 2109)  ondansetron  (ZOFRAN ) injection 4 mg (4 mg Intravenous Given 08/20/24 2109)  ondansetron  (ZOFRAN -ODT) disintegrating tablet 4 mg (4 mg Oral Given 08/20/24 2236)    Clinical Course as of 08/20/24 2324  Thu Aug 20, 2024  2005 CT Renal Stone Study CT stone study reviewed by me with evidence of left ureteral stone that is approximately 5 mm in diameter [AG]   2047 CT Renal Stone Study 3 mm proximal left ureteral stone with mild left hydronephrosis [AG]    Clinical Course User Index [AG] Claudia Alvizo, DO                                 Medical Decision Making Amount and/or Complexity of Data Reviewed Labs: ordered. Radiology: ordered. Decision-making details documented in ED Course.  Risk Prescription drug management.   On initial evaluation patient is hemodynamically stable, afebrile and is in acute distress secondary to flank pain.  Based on patient's history differential diagnosis includes nephrolithiasis, pyelonephritis, acute cystitis, gastroenteritis, colitis.  Will obtain laboratory studies as well as CT stone study with concern for nephrolithiasis.  Will give multimodal pain control and fluid bolus.  CT stone search significant for 3 mm ureteral stone with mild hydronephrosis.  Urinalysis without evidence of underlying infection.  And no evidence of significant leukocytosis or  renal impairment on laboratory studies.  As patient had received fluid bolus and multimodal pain control without evidence of infected stone and without need for acute intervention at this time believe patient is safe to be discharged home.  Patient will be discharged with pain control, antiemetics as well as urology outpatient referral.  Patient was given strict return precautions and agreed with and understood plan of care at time of discharge     Final diagnoses:  Nephrolithiasis    ED Discharge Orders          Ordered    ondansetron  (ZOFRAN ) 4 MG tablet  Every 8 hours PRN,   Status:  Discontinued        08/20/24 2208    tamsulosin  (FLOMAX ) 0.4 MG CAPS capsule  Daily,   Status:  Discontinued        08/20/24 2208    oxyCODONE  (ROXICODONE ) 5 MG immediate release tablet  Every 6 hours PRN,   Status:  Discontinued       Note to Pharmacy: Clincal Indication- acute pain secondary to nephrolithiasis   08/20/24 2208    Ambulatory referral to Urology        Comments: Nephrolithiasis   08/20/24 2208    ondansetron  (ZOFRAN ) 4 MG tablet  Every 8 hours PRN        08/20/24 2223    oxyCODONE  (ROXICODONE ) 5 MG immediate release tablet  Every 6 hours PRN,   Status:  Discontinued       Note to Pharmacy: Clincal Indication- acute pain secondary to nephrolithiasis   08/20/24 2223    tamsulosin  (FLOMAX ) 0.4 MG CAPS capsule  Daily        08/20/24 2223    oxyCODONE  (ROXICODONE ) 5 MG immediate release tablet  Every 6 hours PRN       Note to Pharmacy: Clincal Indication- acute pain secondary to nephrolithiasis   08/20/24 2230            Lavanda Bolster DO Emergency Medicine PGY2    Bolster Lavanda, DO 08/20/24 2324    Dreama Longs, MD 08/21/24 1337

## 2024-08-23 LAB — URINE CULTURE: Culture: 10000 — AB

## 2024-08-24 NOTE — Progress Notes (Signed)
 ED Antimicrobial Stewardship Positive Culture Follow Up   Leonard Henry is an 49 y.o. male who presented to [facility] on 08/20/2024 with a chief complaint of  Chief Complaint  Patient presents with   Flank Pain    Recent Results (from the past 720 hours)  Urine Culture     Status: Abnormal   Collection Time: 08/20/24  7:30 PM   Specimen: Urine, Clean Catch  Result Value Ref Range Status   Specimen Description URINE, CLEAN CATCH  Final   Special Requests   Final    NONE Performed at [facility], [address], [place], [place] [address]    Culture (A)  Final    10,000 COLONIES/mL STAPHYLOCOCCUS HAEMOLYTICUS 10,000 COLONIES/mL KLEBSIELLA OXYTOCA    Report Status 08/23/2024 FINAL  Final   Organism ID, Bacteria STAPHYLOCOCCUS HAEMOLYTICUS (A)  Final   Organism ID, Bacteria KLEBSIELLA OXYTOCA (A)  Final      Susceptibility   Klebsiella oxytoca - MIC*    AMPICILLIN RESISTANT Resistant     CEFEPIME <=0.12 SENSITIVE Sensitive     ERTAPENEM <=0.12 SENSITIVE Sensitive     CEFTRIAXONE <=0.25 SENSITIVE Sensitive     CIPROFLOXACIN <=0.06 SENSITIVE Sensitive     GENTAMICIN <=1 SENSITIVE Sensitive     NITROFURANTOIN 64 INTERMEDIATE Intermediate     TRIMETH/SULFA <=20 SENSITIVE Sensitive     AMPICILLIN/SULBACTAM 8 SENSITIVE Sensitive     PIP/TAZO Value in next row Sensitive ug/mL     <=4 SENSITIVEThis is a modified FDA-approved test that has been validated and its performance characteristics determined by the reporting laboratory.  This laboratory is certified under the Clinical Laboratory Improvement Amendments CLIA as qualified to perform high complexity clinical laboratory testing.    MEROPENEM Value in next row Sensitive      <=4 SENSITIVEThis is a modified FDA-approved test that has been validated and its performance characteristics determined by the reporting laboratory.  This laboratory is certified under the Clinical Laboratory Improvement Amendments CLIA as qualified to perform  high complexity clinical laboratory testing.    * 10,000 COLONIES/mL KLEBSIELLA OXYTOCA   Staphylococcus haemolyticus - MIC*    CIPROFLOXACIN Value in next row Sensitive      <=4 SENSITIVEThis is a modified FDA-approved test that has been validated and its performance characteristics determined by the reporting laboratory.  This laboratory is certified under the Clinical Laboratory Improvement Amendments CLIA as qualified to perform high complexity clinical laboratory testing.    GENTAMICIN Value in next row Sensitive      <=4 SENSITIVEThis is a modified FDA-approved test that has been validated and its performance characteristics determined by the reporting laboratory.  This laboratory is certified under the Clinical Laboratory Improvement Amendments CLIA as qualified to perform high complexity clinical laboratory testing.    NITROFURANTOIN Value in next row Sensitive      <=4 SENSITIVEThis is a modified FDA-approved test that has been validated and its performance characteristics determined by the reporting laboratory.  This laboratory is certified under the Clinical Laboratory Improvement Amendments CLIA as qualified to perform high complexity clinical laboratory testing.    OXACILLIN Value in next row Resistant      <=4 SENSITIVEThis is a modified FDA-approved test that has been validated and its performance characteristics determined by the reporting laboratory.  This laboratory is certified under the Clinical Laboratory Improvement Amendments CLIA as qualified to perform high complexity clinical laboratory testing.    TETRACYCLINE Value in next row Sensitive      <=  4 SENSITIVEThis is a modified FDA-approved test that has been validated and its performance characteristics determined by the reporting laboratory.  This laboratory is certified under the Clinical Laboratory Improvement Amendments CLIA as qualified to perform high complexity clinical laboratory testing.    VANCOMYCIN Value in next row  Sensitive      <=4 SENSITIVEThis is a modified FDA-approved test that has been validated and its performance characteristics determined by the reporting laboratory.  This laboratory is certified under the Clinical Laboratory Improvement Amendments CLIA as qualified to perform high complexity clinical laboratory testing.    TRIMETH/SULFA Value in next row Sensitive      <=4 SENSITIVEThis is a modified FDA-approved test that has been validated and its performance characteristics determined by the reporting laboratory.  This laboratory is certified under the Clinical Laboratory Improvement Amendments CLIA as qualified to perform high complexity clinical laboratory testing.    RIFAMPIN Value in next row Sensitive      <=4 SENSITIVEThis is a modified FDA-approved test that has been validated and its performance characteristics determined by the reporting laboratory.  This laboratory is certified under the Clinical Laboratory Improvement Amendments CLIA as qualified to perform high complexity clinical laboratory testing.    Inducible Clindamycin Value in next row Sensitive      <=4 SENSITIVEThis is a modified FDA-approved test that has been validated and its performance characteristics determined by the reporting laboratory.  This laboratory is certified under the Clinical Laboratory Improvement Amendments CLIA as qualified to perform high complexity clinical laboratory testing.    * 10,000 COLONIES/mL STAPHYLOCOCCUS HAEMOLYTICUS   [x]  Patient discharged originally without antimicrobial agent   New antibiotic prescription: none  ED Provider: Katrinka Roosevelt, PA-C   Plan:  - No antibiotics warranted at this time given insignificant colonies on urine culture, unremarkable UA, afebrile, and patient's urinary symptoms likely attributable to 3 mm stone found on CT imaging  Feliciano Close, PharmD PGY2 Infectious Diseases Pharmacy Resident  08/24/2024 8:39 AM

## 2024-08-25 DIAGNOSIS — N201 Calculus of ureter: Secondary | ICD-10-CM | POA: Insufficient documentation

## 2024-08-25 NOTE — Assessment & Plan Note (Addendum)
 3mm obstructive Left proximal ureteral stone, mild hydro Negative UA   We reviewed recent clinical history, lab data and pertinent imaging today. Based on the size and location of their stone, I would recommend medical expulsive therapy and a trial of passage, at least initially. Provided adequate symptomatic control and an absence of infectious signs, I typically allow 3-4 weeks for spontaneous passage. Return precautions were verbalized, including new fevers/chills, intractable N/V, inability to tolerate PO liquids, or severe refractory pain.    - MET x 3-4 weeks - Flomax  0.4mg  prescribed (#30 tabs) - r/b/SEs reviewed - Urine strainer provided   - If stone visualized, please collect and bring to clinic for analysis  - Clinic / on-call info provided - RTC in 3-4 weeks for reevaluation- if symptomatic will consider definitive treatment. If asymptomatic or unclear stone passage, will consider interval imaging

## 2024-08-25 NOTE — Progress Notes (Unsigned)
 08/26/24 11:30 AM   Leonard Henry 1975/08/23 [id number]  CC: kidney stone   HPI: Initial visit with me today, consult for nephrolithiasis Seen in ED 08/20/24 - Left flank pain,  CT Stone - 3mm Left proximal ureteral stone w/ mild hydro  Unfortunately, bounce back to ED last night 08/26/24, pain control. IV toradol  and prescribed additional narcotics, discharged.   Here today with continued left flank pain, moderately uncomfortable with nausea, no recent vomiting.   This is 4th lifetime stone, two prior spont. Passed stones, two prior surgeries. Sounds like URS in 2005 and 2017.   PMH: Past Medical History:  Diagnosis Date   CAD (coronary artery disease)    Chest pain    Hyperlipidemia    Hypertension    Kidney stone     Surgical History: Past Surgical History:  Procedure Laterality Date   LEFT HEART CATH AND CORONARY ANGIOGRAPHY N/A 08/22/2021   Procedure: LEFT HEART CATH AND CORONARY ANGIOGRAPHY;  Surgeon: Elmira Newman PARAS, MD;  Location: [facility];  Service: Cardiovascular;  Laterality: N/A;    Family History: Family History  Problem Relation Age of Onset   Hypertension Mother    Diabetes Mother    Heart disease Mother    Hyperlipidemia Mother    Diabetes Father    Hypertension Father    Heart disease Father    Colon cancer Neg Hx    Esophageal cancer Neg Hx    Rectal cancer Neg Hx    Stomach cancer Neg Hx     Social History:  reports that he has never smoked. He has never used smokeless tobacco. He reports current alcohol use. He reports that he does not use drugs.  Physical Exam: BP 109/74 (BP Location: Left Arm, Patient Position: Sitting, Cuff Size: Large)   Pulse (!) 55   Ht 5' 10 (1.778 m)   Wt 237 lb 9.6 oz (107.8 kg)   SpO2 99%   BMI 34.09 kg/m    Constitutional:  Alert and oriented, mildly uncomfortable, some nausea Cardiovascular: No clubbing, cyanosis, or edema. Respiratory: Normal respiratory effort, no increased work of  breathing. GI: Nondistended Skin: No rashes, bruises or suspicious lesions. Neurologic: Grossly intact, no focal deficits, moving all 4 extremities. Psychiatric: Normal mood and affect.  Laboratory Data:  Latest Reference Range & Units 08/20/24 19:30  Creatinine 0.61 - 1.24 mg/dL 9.15   UA negative (1/78/74)  - Ucx likely contaminant w/ x2 10k organisms   Pertinent Imaging: I have personally viewed and interpreted the CT Stone (08/20/24) - 3mm Left proximal ureteral stone with mild hydro. Punctate LUP nonobstructive stone. Otherwise morphologically normal bilateral kidneys and bladder.    Assessment & Plan:    Left ureteral stone Assessment & Plan: 3mm obstructive Left proximal ureteral stone, mild hydro Hx of x2 prior URS Negative UA   We reviewed recent clinical history, lab data and pertinent imaging today. Based on the size and location of their stone, I would recommend medical expulsive therapy and a trial of passage, at least initially. He has had x2 ED visits so far-- and today his symptoms are moderate in severity. Provided adequate symptomatic control and an absence of infectious signs, I typically allow 3-4 weeks for spontaneous passage. Return precautions were verbalized, including new fevers/chills, intractable N/V, inability to tolerate PO liquids, or severe refractory pain.    I would like to avoid surgery for a 3mm stone, although if he re-presents with intractable pain we will need to consider URS.   -  MET x 3-4 weeks - Flomax  0.4mg  prescribed (#30 tabs) - r/b/SEs reviewed - Urine strainer provided   - If stone visualized, please collect and bring to clinic for analysis  - Clinic / on-call info provided - RTC in 3-4 weeks for reevaluation- if symptomatic will consider definitive treatment. If asymptomatic or unclear stone passage, will consider interval imaging    Other orders -     Tamsulosin  HCl; Take 1 capsule (0.4 mg total) by mouth daily.  Dispense: 30  capsule; Refill: 0 -     Ketorolac  Tromethamine ; Take 1 tablet (10 mg total) by mouth every 6 (six) hours as needed.  Dispense: 20 tablet; Refill: 0      Penne Skye, MD 08/26/2024  [facility] [address], [address] [place], [place] [address] [phone]

## 2024-08-26 ENCOUNTER — Ambulatory Visit (INDEPENDENT_AMBULATORY_CARE_PROVIDER_SITE_OTHER): Admitting: Urology

## 2024-08-26 ENCOUNTER — Encounter: Payer: Self-pay | Admitting: Urology

## 2024-08-26 ENCOUNTER — Emergency Department (HOSPITAL_COMMUNITY): Admission: EM | Admit: 2024-08-26 | Discharge: 2024-08-26 | Disposition: A | Discharge status: Home / Self Care

## 2024-08-26 ENCOUNTER — Other Ambulatory Visit: Payer: Self-pay

## 2024-08-26 VITALS — BP 109/74 | HR 55 | Ht 70.0 in | Wt 237.6 lb

## 2024-08-26 DIAGNOSIS — R109 Unspecified abdominal pain: Secondary | ICD-10-CM | POA: Diagnosis present

## 2024-08-26 DIAGNOSIS — I10 Essential (primary) hypertension: Secondary | ICD-10-CM | POA: Insufficient documentation

## 2024-08-26 DIAGNOSIS — I2510 Atherosclerotic heart disease of native coronary artery without angina pectoris: Secondary | ICD-10-CM | POA: Diagnosis not present

## 2024-08-26 DIAGNOSIS — D72829 Elevated white blood cell count, unspecified: Secondary | ICD-10-CM | POA: Insufficient documentation

## 2024-08-26 DIAGNOSIS — N201 Calculus of ureter: Secondary | ICD-10-CM | POA: Diagnosis not present

## 2024-08-26 DIAGNOSIS — N132 Hydronephrosis with renal and ureteral calculous obstruction: Secondary | ICD-10-CM | POA: Insufficient documentation

## 2024-08-26 LAB — CBC
HCT: 42.8 % (ref 39.0–52.0)
Hemoglobin: 15.1 g/dL (ref 13.0–17.0)
MCH: 29 pg (ref 26.0–34.0)
MCHC: 35.3 g/dL (ref 30.0–36.0)
MCV: 82.1 fL (ref 80.0–100.0)
Platelets: 220 K/uL (ref 150–400)
RBC: 5.21 MIL/uL (ref 4.22–5.81)
RDW: 13.4 % (ref 11.5–15.5)
WBC: 13 K/uL — ABNORMAL HIGH (ref 4.0–10.5)
nRBC: 0 % (ref 0.0–0.2)

## 2024-08-26 LAB — URINALYSIS, ROUTINE W REFLEX MICROSCOPIC
Bacteria, UA: NONE SEEN
Bilirubin Urine: NEGATIVE
Glucose, UA: NEGATIVE mg/dL
Ketones, ur: NEGATIVE mg/dL
Leukocytes,Ua: NEGATIVE
Nitrite: NEGATIVE
Protein, ur: NEGATIVE mg/dL
Specific Gravity, Urine: 1.02 (ref 1.005–1.030)
pH: 5 (ref 5.0–8.0)

## 2024-08-26 LAB — BASIC METABOLIC PANEL WITH GFR
Anion gap: 9 (ref 5–15)
BUN: 14 mg/dL (ref 6–20)
CO2: 21 mmol/L — ABNORMAL LOW (ref 22–32)
Calcium: 9.6 mg/dL (ref 8.9–10.3)
Chloride: 104 mmol/L (ref 98–111)
Creatinine, Ser: 1.18 mg/dL (ref 0.61–1.24)
GFR, Estimated: >60 mL/min (ref >60)
Glucose, Bld: 140 mg/dL — ABNORMAL HIGH (ref 70–99)
Potassium: 3.9 mmol/L (ref 3.5–5.1)
Sodium: 134 mmol/L — ABNORMAL LOW (ref 135–145)

## 2024-08-26 MED ORDER — TAMSULOSIN HCL 0.4 MG PO CAPS: 0.4000 mg | ORAL_CAPSULE | Freq: Every day | ORAL | 0 refills | Status: AC

## 2024-08-26 MED ORDER — KETOROLAC TROMETHAMINE 10 MG PO TABS: 10.0000 mg | ORAL_TABLET | Freq: Four times a day (QID) | ORAL | 0 refills | Status: DC | PRN

## 2024-08-26 MED ORDER — KETOROLAC TROMETHAMINE 15 MG/ML IJ SOLN
15.0000 mg | Freq: Once | INTRAMUSCULAR | Status: AC
  Administered 2024-08-26: 15 mg via INTRAVENOUS
  Filled 2024-08-26: qty 1

## 2024-08-26 MED ORDER — ONDANSETRON 4 MG PO TBDP: 4.0000 mg | ORAL_TABLET | Freq: Three times a day (TID) | ORAL | 0 refills | Status: DC | PRN

## 2024-08-26 MED ORDER — ONDANSETRON HCL 4 MG/2ML IJ SOLN
4.0000 mg | Freq: Once | INTRAMUSCULAR | Status: AC
  Administered 2024-08-26: 4 mg via INTRAVENOUS
  Filled 2024-08-26: qty 2

## 2024-08-26 MED ORDER — OXYCODONE-ACETAMINOPHEN 5-325 MG PO TABS: 1.0000 | ORAL_TABLET | Freq: Four times a day (QID) | ORAL | 0 refills | Status: AC | PRN

## 2024-08-26 MED ORDER — OXYCODONE-ACETAMINOPHEN 5-325 MG PO TABS
1.0000 | ORAL_TABLET | Freq: Once | ORAL | Status: AC
  Administered 2024-08-26: 1 via ORAL
  Filled 2024-08-26: qty 1

## 2024-08-26 NOTE — ED Provider Notes (Signed)
 [place] EMERGENC[facility] DEPARTMENT AT [facility] Provider Note   CSN: [id number] Arrival date & time: 08/26/24  9264     Patient presents with: Flank Pain   Leonard Henry is a 49 y.o. male.   Is a 49 year old male with history of recurrent kidney stones presents with left flank plain in the setting of recently diagnosed stone.  He ran out of his oxycodones last night.  Continues to have pain nausea and vomiting.  Symptoms have not worsened, but just have not improved.  He has an appointment with urology this morning at 11 AM, but did not think he could make it due to pain.  He is still urinating, no fevers, no abdominal pain no chest pain no shortness of breath.   Flank Pain       Prior to Admission medications   Medication Sig Start Date End Date Taking? Authorizing Provider  cyanocobalamin  (VITAMIN B12) 1000 MCG/ML injection Inject 1 mL (1,000 mcg total) into the muscle every 30 (thirty) days. 07/14/24   Thedora Garnette HERO, MD  diltiazem  (CARDIZEM  CD) 120 MG 24 hr capsule Take 1 capsule (120 mg total) by mouth daily. 07/14/24   Thedora Garnette HERO, MD  nitroGLYCERIN  (NITROSTAT ) 0.4 MG SL tablet Place 1 tablet (0.4 mg total) under the tongue every 5 (five) minutes as needed for chest pain. 07/30/23 07/13/24  Thedora Garnette HERO, MD  oxyCODONE  (ROXICODONE ) 5 MG immediate release tablet Take 1 tablet (5 mg total) by mouth every 6 (six) hours as needed. 08/20/24   Dreama Longs, MD  rosuvastatin  (CRESTOR ) 40 MG tablet Take 1 tablet (40 mg total) by mouth daily. 07/13/24   Thedora Garnette HERO, MD  tamsulosin  (FLOMAX ) 0.4 MG CAPS capsule Take 1 capsule (0.4 mg total) by mouth daily for 14 days. 08/20/24 09/03/24  Nada Chroman, DO    Allergies: Iopamidol , Egg-derived products, Iodinated contrast media, Iodine, and Other    Review of Systems  Genitourinary:  Positive for flank pain.    Updated Vital Signs BP 121/89   Pulse 70   Temp 98.1 F (36.7 C) (Oral)   Resp 17   Ht 5' 10 (1.778 m)    Wt 108.9 kg   SpO2 98%   BMI 34.44 kg/m   Physical Exam Vitals and nursing note reviewed.  Constitutional:      General: He is not in acute distress.    Appearance: He is not toxic-appearing.  HENT:     Mouth/Throat:     Mouth: Mucous membranes are moist.  Eyes:     Conjunctiva/sclera: Conjunctivae normal.  Cardiovascular:     Rate and Rhythm: Normal rate and regular rhythm.  Pulmonary:     Effort: Pulmonary effort is normal.  Abdominal:     General: Abdomen is flat. There is no distension.     Tenderness: There is no abdominal tenderness. There is no right CVA tenderness, left CVA tenderness, guarding or rebound.  Musculoskeletal:     Right lower leg: No edema.     Left lower leg: No edema.  Skin:    General: Skin is warm.     Capillary Refill: Capillary refill takes less than 2 seconds.  Neurological:     Mental Status: He is alert.  Psychiatric:        Mood and Affect: Mood normal.        Behavior: Behavior normal.     (all labs ordered are listed, but only abnormal results are displayed) Labs Reviewed  CBC -  Abnormal; Notable for the following components:      Result Value   WBC 13.0 (*)    All other components within normal limits  BASIC METABOLIC PANEL WITH GFR - Abnormal; Notable for the following components:   Sodium 134 (*)    CO2 21 (*)    Glucose, Bld 140 (*)    All other components within normal limits  URINALYSIS, ROUTINE W REFLEX MICROSCOPIC - Abnormal; Notable for the following components:   Hgb urine dipstick LARGE (*)    All other components within normal limits    EKG: EKG Interpretation Date/Time:  Wednesday August 26 2024 07:54:37 EDT Ventricular Rate:  66 PR Interval:  224 QRS Duration:  105 QT Interval:  409 QTC Calculation: 429 R Axis:   -5  Text Interpretation: Sinus rhythm Prolonged PR interval Low voltage, precordial leads Consider anterior infarct Borderline T abnormalities, inferior leads Confirmed by Neysa Clap [phone]) on  08/26/2024 8:49:30 AM  Radiology: No results found.   Procedures   Medications Ordered in the ED  ketorolac  (TORADOL ) 15 MG/ML injection 15 mg (15 mg Intravenous Given 08/26/24 0827)  ondansetron  (ZOFRAN ) injection 4 mg (4 mg Intravenous Given 08/26/24 0827)  oxyCODONE -acetaminophen  (PERCOCET/ROXICET) 5-325 MG per tablet 1 tablet (1 tablet Oral Given 08/26/24 0827)    Clinical Course as of 08/26/24 0856  Wed Aug 26, 2024  0741 IMPRESSION: 3 mm proximal left ureteral stone with mild left hydronephrosis.   Punctate left upper pole nephrolithiasis.  [TY]  0844 WBC(!): 13.0 No fever or tachycardia, UA without infection.  Suspect reactionary [TY]  [id number] Basic metabolic panel(!) No significant metabolic derangements.  Normal kidney function [TY]  0844 Urinalysis, Routine w reflex microscopic -Urine, Clean Catch(!) Not infected. [TY]  [id number] Patient feeling improved.  Will discharge to follow-up with his urologist whom he has an appointment with today and 11. [TY]    Clinical Course User Index [TY] Neysa Clap PARAS, DO                                 Medical Decision Making Is well-appearing 49 year old male presenting emergency department for flank pain in the setting of recently diagnosed kidney stone.  Complex past medical history to include hypertension hyperlipidemia CAD and recurrent kidney stones.  Per chart review has 3 mm stone with mild hydronephrosis diagnosed a week ago in the ED.  EMS reported giving 100 mcg of fentanyl  prior to arrival.  He is feeling improved.  Will get basic labs and a UA to rule out  infected stone, however lower suspicion given no fever or tachycardia and overall clinical picture, will give further pain medications and nausea medications.  Hopefully, we can get his symptoms under control so he can see his urologist outpatient as planned.  See ED course for further MDM and final disposition.  Amount and/or Complexity of Data Reviewed Labs: ordered.  Decision-making details documented in ED Course. Radiology:     Details: Considered CT, however only 3 mm stone on prior scan with history of recurrent stones.  His pain today is unchanged, lower suspicion for alternative diagnoses and the risk of radiation does not outweigh the diagnostic benefit of a repeat scan at this point in time as he is likely to pass the stone spontaneously  Risk Prescription drug management. Decision regarding hospitalization. Diagnosis or treatment significantly limited by social determinants of health. Risk Details: Poor health literacy  Final diagnoses:  None    ED Discharge Orders     None          Neysa Caron PARAS, [place] 08/26/24 [phone]

## 2024-08-26 NOTE — ED Notes (Signed)
This RN reviewed discharge instructions with patient. He verbalized understanding and denied any further questions. PT well appearing upon discharge and reports tolerable pain. Pt ambulated with stable gait to exit. Pt endorses ride home.  

## 2024-08-26 NOTE — Discharge Instructions (Addendum)
 Go to your urologist appointment.

## 2024-08-26 NOTE — ED Triage Notes (Signed)
 Pt bib [facility] from home with complaints of left flank pain for a couple days. Pt was dx with kidney stones on 8/21. Pt has been taking prescribed medications but has had little relief. Given 100mcg fentanyl  and 4mg  zofran  en route. Ambulatory to bathroom on arrival

## 2024-08-26 NOTE — Patient Instructions (Signed)
 Kidney Stones  Kidney stones are solid, rock-like deposits that form inside of the kidneys. The kidneys are a pair of organs that make urine. A kidney stone may form in a kidney and move into other parts of the urinary tract, including the tubes that connect the kidneys to the bladder (ureters), the bladder, and the tube that carries urine out of the body (urethra). As the stone moves through these areas, it can cause intense pain and block the flow of urine. Kidney stones are created when high levels of certain minerals are found in the urine. The stones are usually passed out of the body through urination, but in some cases, medical treatment may be needed to remove them. What are the causes? Kidney stones may be caused by: A condition in which certain glands produce too much parathyroid hormone (primary hyperparathyroidism), which causes too much calcium  buildup in the blood. A buildup of uric acid crystals in the bladder (hyperuricosuria). Uric acid is a chemical that the body produces when you eat certain foods. It usually leaves the body in the urine. Narrowing (stricture) of one or both of the ureters. A kidney blockage that is present at birth (congenital obstruction). Past surgery on the kidney or the ureters. What increases the risk? The following factors may make you more likely to develop this condition: Having had a kidney stone in the past. Having a family history of kidney stones. Not drinking enough water. Eating a diet that is high in protein, salt (sodium), or sugar. Being overweight or obese. What are the signs or symptoms? Symptoms of a kidney stone may include: Pain in the side of the abdomen, right below the ribs (flank pain). Pain usually spreads (radiates) to the groin. Needing to urinate often or urgently. Painful urination. Blood in the urine (hematuria). Nausea. Vomiting. Fever and chills. How is this diagnosed? This condition may be diagnosed based on: Your  symptoms and medical history. A physical exam. Blood tests. Urine tests. These may be done before and after the stone passes out of your body through urination. Imaging tests, such as a CT scan, abdominal X-ray, or ultrasound. A procedure to examine the inside of the bladder (cystoscopy). How is this treated? Treatment for kidney stones depends on the size, location, and makeup of the stones. Kidney stones will often pass out of the body through urination. You may need to: Increase your fluid intake to help pass the stone. In some cases, you may be given fluids through an IV and may need to be monitored in the hospital. Take medicine for pain. Make changes in your diet to help prevent kidney stones from coming back. Sometimes, procedures are needed to remove a kidney stone. This may involve: A procedure to break up kidney stones using: A focused beam of light (laser therapy). Shock waves (extracorporeal shock wave lithotripsy). Surgery to remove kidney stones. This may be needed if you have severe pain or have stones that block your urinary tract. Follow these instructions at home: Medicines Take over-the-counter and prescription medicines only as told by your health care provider. Ask your health care provider if the medicine prescribed to you requires you to avoid driving or using heavy machinery. Eating and drinking Drink enough fluid to keep your urine pale yellow. You may be instructed to drink at least 8-10 glasses of water each day. This will help you pass the kidney stone. If directed, change your diet. This may include: Limiting how much sodium you eat. Eating more fruits  and vegetables. Limiting how much animal protein you eat. Animal proteins include red meat, poultry, fish, and eggs. Eating a normal amount of calcium  (1,000-1,300 mg per day). Follow instructions from your health care provider about eating or drinking restrictions. General instructions Collect urine samples as  told by your health care provider. You may need to collect a urine sample: 24 hours after you pass the stone. 8-12 weeks after you pass the kidney stone, and every 6-12 months after that. Strain your urine every time you urinate, for as long as directed. Use the strainer that your health care provider recommends. Do not throw out the kidney stone after passing it. Keep the stone so it can be tested by your health care provider. Testing the makeup of your kidney stone may help prevent you from getting kidney stones in the future. Keep all follow-up visits. You may need follow-up X-rays or ultrasounds to make sure that your stone has passed. How is this prevented? To prevent another kidney stone: Drink enough fluid to keep your urine pale yellow. This is the best way to prevent kidney stones. Eat a healthy diet. Follow recommendations from your health care provider about foods to avoid. Recommendations vary depending on the type of kidney stone that you have. You may be instructed to eat a low-protein diet. Maintain a healthy weight. Where to find more information National Kidney Foundation (NKF): www.kidney.org [facility]): www.urologyhealth.org Contact a health care provider if: You have pain that gets worse or does not get better with medicine. Get help right away if: You have a fever or chills. You develop severe pain. You develop new abdominal pain. You faint. You are unable to urinate. Summary Kidney stones are solid, rock-like deposits that form inside of the kidneys. Kidney stones can cause nausea, vomiting, blood in the urine, abdominal pain, and the urge to urinate often. Treatment for kidney stones depends on the size, location, and makeup of the stones. Kidney stones will often pass out of the body through urination. Kidney stones can be prevented by drinking enough fluids, eating a healthy diet, and maintaining a healthy weight. This information is not intended  to replace advice given to you by your health care provider. Make sure you discuss any questions you have with your health care provider. Document Revised: 03/28/2022 Document Reviewed: 03/28/2022 Elsevier Patient Education  2024 [employer].

## 2024-09-14 NOTE — Progress Notes (Deleted)
   09/14/24 11:54 AM   Leonard Henry 1975/01/23 [id number]  CC: kidney stone   HPI: 09/21/24 -   Prior HPI: Initial visit with me today, consult for nephrolithiasis Seen in ED 08/20/24 - Left flank pain,  CT Stone - 3mm Left proximal ureteral stone w/ mild hydro  Unfortunately, bounce back to ED last night 08/26/24, pain control. IV toradol  and prescribed additional narcotics, discharged.   Here today with continued left flank pain, moderately uncomfortable with nausea, no recent vomiting.   This is 4th lifetime stone, two prior spont. Passed stones, two prior surgeries. Sounds like URS in 2005 and 2017.   PMH: Past Medical History:  Diagnosis Date   CAD (coronary artery disease)    Chest pain    Hyperlipidemia    Hypertension    Kidney stone     Surgical History: Past Surgical History:  Procedure Laterality Date   LEFT HEART CATH AND CORONARY ANGIOGRAPHY N/A 08/22/2021   Procedure: LEFT HEART CATH AND CORONARY ANGIOGRAPHY;  Surgeon: Elmira Newman PARAS, MD;  Location: [facility];  Service: Cardiovascular;  Laterality: N/A;    Family History: Family History  Problem Relation Age of Onset   Hypertension Mother    Diabetes Mother    Heart disease Mother    Hyperlipidemia Mother    Diabetes Father    Hypertension Father    Heart disease Father    Colon cancer Neg Hx    Esophageal cancer Neg Hx    Rectal cancer Neg Hx    Stomach cancer Neg Hx     Social History:  reports that he has never smoked. He has never used smokeless tobacco. He reports current alcohol use. He reports that he does not use drugs.  Physical Exam: There were no vitals taken for this visit.   Constitutional:  Alert and oriented, mildly uncomfortable, some nausea Cardiovascular: No clubbing, cyanosis, or edema. Respiratory: Normal respiratory effort, no increased work of breathing. GI: Nondistended Skin: No rashes, bruises or suspicious lesions. Neurologic: Grossly intact, no focal  deficits, moving all 4 extremities. Psychiatric: Normal mood and affect.  Laboratory Data:  Latest Reference Range & Units 08/20/24 19:30  Creatinine 0.61 - 1.24 mg/dL 9.15   UA negative (1/78/74)  - Ucx likely contaminant w/ x2 10k organisms   Pertinent Imaging: I have personally viewed and interpreted the CT Stone (08/20/24) - 3mm Left proximal ureteral stone with mild hydro. Punctate LUP nonobstructive stone. Otherwise morphologically normal bilateral kidneys and bladder.    Assessment & Plan:    Left ureteral stone Assessment & Plan: 3mm obstructive Left proximal ureteral stone, mild hydro Hx of x2 prior URS Negative UA           Penne Skye, MD 09/14/2024  [facility] [address] [place], [place] [address] [phone]

## 2024-09-14 NOTE — Assessment & Plan Note (Deleted)
 3mm obstructive Left proximal ureteral stone, mild hydro Hx of x2 prior URS Negative UA

## 2024-09-21 ENCOUNTER — Ambulatory Visit: Admitting: Urology

## 2024-09-21 ENCOUNTER — Ambulatory Visit: Admitting: Family Medicine

## 2024-09-21 DIAGNOSIS — N201 Calculus of ureter: Secondary | ICD-10-CM

## 2024-09-23 ENCOUNTER — Ambulatory Visit: Admitting: Urology

## 2024-09-30 ENCOUNTER — Telehealth: Payer: Self-pay | Admitting: Family Medicine

## 2024-09-30 NOTE — Telephone Encounter (Signed)
 12/09/2023 no show 09/21/2024 no show  Final warning sent via mail and mychart

## 2024-10-15 ENCOUNTER — Ambulatory Visit: Admitting: Family Medicine

## 2024-10-27 ENCOUNTER — Ambulatory Visit: Admitting: Family Medicine

## 2024-10-27 ENCOUNTER — Encounter: Payer: Self-pay | Admitting: Family Medicine

## 2024-10-27 ENCOUNTER — Ambulatory Visit: Payer: Self-pay | Admitting: Family Medicine

## 2024-10-27 VITALS — BP 118/76 | HR 87 | Temp 98.1°F | Ht 70.0 in | Wt 241.6 lb

## 2024-10-27 DIAGNOSIS — I2510 Atherosclerotic heart disease of native coronary artery without angina pectoris: Secondary | ICD-10-CM

## 2024-10-27 DIAGNOSIS — R079 Chest pain, unspecified: Secondary | ICD-10-CM

## 2024-10-27 DIAGNOSIS — E538 Deficiency of other specified B group vitamins: Secondary | ICD-10-CM

## 2024-10-27 DIAGNOSIS — Z23 Encounter for immunization: Secondary | ICD-10-CM

## 2024-10-27 LAB — VITAMIN B12: Vitamin B-12: 506 pg/mL (ref 211–911)

## 2024-10-27 MED ORDER — BD LUER-LOK SYRINGE 25G X 1-1/2" 3 ML MISC: 0 refills | Status: AC

## 2024-10-27 NOTE — Assessment & Plan Note (Signed)
 I will reassess his vitamin B12 level today. Continue Vitamin B12 1,000 mcg IM monthly.

## 2024-10-27 NOTE — Progress Notes (Signed)
 [facility] [address] [place] [address] Dept: [phone] Dept Fax: [phone]  Chronic Care Office Visit  Subjective:    Patient ID: Leonard Henry, male    DOB: 07-12-1975, 49 y.o..   MRN: [id number]  Chief Complaint  Patient presents with   Follow-up    F/u meds.  No concerns  flu shot today.    History of Present Illness:  Patient is in today for reassessment of chronic medical issues.  Mr. Leonard Henry has a history of coronary artery disease.  He is being managed with diltiazem  120 mg daily and rosuvastatin  20 mg daily. Cardiology did not feel he needed to take a daily aspirin , as his prior cath showed only a 30% stenosis of the LAD ostium. Mr. Leonard Henry notes occasional tightness of the chest which occurs at rest. He describes the discomfort as a sharp, intermittent pain along the bottom side of his left breast area.    Mr. Leonard Henry has a history of peripheral neuropathy, likely secondary to vitamin B12 deficiency. At his last visit, we switched him to Vitamin B12 injections. He notes he has taken this for the past 3 months.  Past Medical History: Patient Active Problem List   Diagnosis Date Noted   Left ureteral stone 08/25/2024   Lumbar degenerative disc disease 11/11/2023   Vitamin D  insufficiency 11/11/2023   Peripheral polyneuropathy 11/11/2023   Prediabetes 11/11/2023   Vitamin B12 deficiency 07/30/2023   Coronary artery disease 12/20/2021   Bilateral primary osteoarthritis of knee 12/20/2021   Class 1 obesity due to excess calories with body mass index (BMI) of 34.0 to 34.9 in adult 12/20/2021   Mixed hyperlipidemia 06/08/2021   Lateral epicondylitis of both elbows 02/05/2019   Plantar fasciitis, bilateral 04/22/2018   Past Surgical History:  Procedure Laterality Date   LEFT HEART CATH AND CORONARY ANGIOGRAPHY N/A 08/22/2021   Procedure: LEFT HEART CATH AND CORONARY ANGIOGRAPHY;  Surgeon: Leonard Leonard Henry PARAS, MD;   Location: [facility];  Service: Cardiovascular;  Laterality: N/A;   Family History  Problem Relation Age of Onset   Hypertension Mother    Diabetes Mother    Heart disease Mother    Hyperlipidemia Mother    Diabetes Father    Hypertension Father    Heart disease Father    Colon cancer Neg Hx    Esophageal cancer Neg Hx    Rectal cancer Neg Hx    Stomach cancer Neg Hx    Outpatient Medications Prior to Visit  Medication Sig Dispense Refill   cyanocobalamin  (VITAMIN B12) 1000 MCG/ML injection Inject 1 mL (1,000 mcg total) into the muscle every 30 (thirty) days. 3 mL 3   diltiazem  (CARDIZEM  CD) 120 MG 24 hr capsule Take 1 capsule (120 mg total) by mouth daily. 90 capsule 3   nitroGLYCERIN  (NITROSTAT ) 0.4 MG SL tablet Place 1 tablet (0.4 mg total) under the tongue every 5 (five) minutes as needed for chest pain. 30 tablet 3   rosuvastatin  (CRESTOR ) 40 MG tablet Take 1 tablet (40 mg total) by mouth daily. 90 tablet 3   ketorolac  (TORADOL ) 10 MG tablet Take 1 tablet (10 mg total) by mouth every 6 (six) hours as needed. 20 tablet 0   ondansetron  (ZOFRAN -ODT) 4 MG disintegrating tablet Take 1 tablet (4 mg total) by mouth every 8 (eight) hours as needed for nausea or vomiting. 20 tablet 0   oxyCODONE  (ROXICODONE ) 5 MG immediate release tablet Take 1 tablet (5 mg total)  by mouth every 6 (six) hours as needed. 15 tablet 0   Facility-Administered Medications Prior to Visit  Medication Dose Route Frequency Provider Last Rate Last Admin   cyanocobalamin  (VITAMIN B12) injection 1,000 mcg  1,000 mcg Intramuscular Q30 days    1,000 mcg at 07/22/24 0244   Allergies  Allergen Reactions   Iopamidol  Itching and Nausea And Vomiting   Egg Protein-Containing Drug Products Nausea And Vomiting   Iodinated Contrast Media Other (See Comments)   Iodine Other (See Comments)   Other Itching    CAT SCAN DYE   Objective:   Today's Vitals   10/27/24 1336  BP: 118/76  Pulse: 87  Temp: 98.1 F (36.7  C)  TempSrc: Temporal  SpO2: 96%  Weight: 241 lb 9.6 oz (109.6 kg)  Height: 5' 10 (1.778 m)   Body mass index is 34.67 kg/m.   General: Well developed, well nourished. No acute distress. CV: RRR without murmurs or rubs. Pulses 2+ bilaterally. Psych: Alert and oriented. Normal mood and affect.  Health Maintenance Due  Topic Date Due   Pneumococcal Vaccine (1 of 2 - PCV) Never done   Hepatitis B Vaccines 19-59 Average Risk (1 of 3 - 19+ 3-dose series) Never done   Influenza Vaccine  07/31/2024   EKG: Normal sinus rhythm (rate = 76). Low voltage in precordial leads, with poor R-wave progression, inverted T-waves in III, aVF. Stable compared to Aug. 2025.    Assessment & Plan:   Problem List Items Addressed This Visit       Cardiovascular and Mediastinum   Coronary artery disease   [name] has been having some chest tightness at times, occurring at rest. Continue diltiazem  120 mg daily. I rwill refer him to see Dr. Elmira to schedule a follow-up regarding his CAD.      Relevant Orders   Ambulatory referral to Cardiology     Other   Vitamin B12 deficiency   I will reassess his vitamin B12 level today. Continue Vitamin B12 1,000 mcg IM monthly.      Relevant Medications   SYRINGE-NEEDLE, DISP, 3 ML (B-D 3CC LUER-LOK SYR 25GX1/2) 25G X 1-1/2 3 ML MISC   Other Relevant Orders   Vitamin B12 (Completed)   Other Visit Diagnoses       Chest pain, unspecified type    -  Primary   Appears to be non-cardiac. However, in light of known CAD, I recommend he follow-up with cardiology.   Relevant Orders   EKG 12-Lead (Completed)   Ambulatory referral to Cardiology     Need for immunization against influenza       Relevant Orders   Flu vaccine trivalent PF, 6mos and older(Flulaval,Afluria,Fluarix,Fluzone) (Completed)       Return in about 6 months (around 04/27/2025) for Reassessment.   Garnette CHRISTELLA Simpler, MD

## 2024-10-27 NOTE — Assessment & Plan Note (Signed)
 Leonard Henry has been having some chest tightness at times, occurring at rest. Continue diltiazem  120 mg daily. I rwill refer him to see Dr. Elmira to schedule a follow-up regarding his CAD.

## 2024-12-14 NOTE — Progress Notes (Deleted)
°  Cardiology Office Note   Date:  12/14/2024  ID:  Leonard Henry, DOB 1975/04/12, MRN [id number] PCP: Thedora Garnette HERO, MD  [facility] Providers Cardiologist:  None    History of Present Illness Leonard Henry is a 49 y.o. male with a past medical history of coronary artery disease, hyperlipidemia, class I obesity, prediabetes here for follow-up appointment.  He was being managed medically with diltiazem  120 mg daily and rosuvastatin  20 mg daily.  It was not felt that he needed an aspirin  a day.  Last seen 03/22/2023.  Overdue for follow-up appointment.  Angina was thought to be microvascular dysfunction.  Patient was doing well at last office visit.  Walking 30 minutes regularly without any chest pain.  Patient is originally from [place].  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  He has a strong family history of coronary artery disease with father having MI at age 46 and mother having undergone a stent as well as bypass surgery in her 64s.  Patient has had left-sided dull pain for almost a year.  Not a very strong historian and unable to qualify and quantify the pain.  However, denies any specific correlation with exertion.  For example, he usually does not feel his pain when he is working or occasionally walking.  Pain usually happens after a long day of work, at rest.  Patient was seen in the ER 7/21.  At that time ACS was excluded.  CT of the chest showed no PE but showed peripheral opacities concerning for possible COVID infection.  Initial LDL was 124 prior to starting lipid-lowering therapies.  Does not smoke, drinks alcohol only occasionally.  His diet does not include red meat and he tries to avoid fatty foods.  He eats fast food once or twice a week.  Today, he***  ROS: ***  Studies Reviewed      CT cardiac scoring 06/08/2021: LM: 0 LAD: 3.3 LCx: 0 RCA: 16.7 Total score: 20   CTA PE protocol 07/20/2021: 1.  No evidence of pulmonary embolism. 2. Bibasilar peripherally  predominant heterogeneously enhancing opacities. Differential considerations include atelectasis versus atypical infection, including COVID-19 given peripheral predominance.   Risk Assessment/Calculations {Does this patient have ATRIAL FIBRILLATION?:[id number]} No BP recorded.  {Refresh Note OR Click here to enter BP  :1}***       Physical Exam VS:  There were no vitals taken for this visit.       Wt Readings from Last 3 Encounters:  10/27/24 241 lb 9.6 oz (109.6 kg)  08/26/24 237 lb 9.6 oz (107.8 kg)  08/26/24 240 lb (108.9 kg)    GEN: Well nourished, well developed in no acute distress NECK: No JVD; No carotid bruits CARDIAC: ***RRR, no murmurs, rubs, gallops RESPIRATORY:  Clear to auscultation without rales, wheezing or rhonchi  ABDOMEN: Soft, non-tender, non-distended EXTREMITIES:  No edema; No deformity   ASSESSMENT AND PLAN Chest pain/CAD Mixed hyperlipidemia    {Are you ordering a CV Procedure (e.g. stress test, cath, DCCV, TEE, etc)?   Press F2        :[id number]}  Dispo: ***  Signed, Orren LOISE Fabry, PA-C

## 2024-12-21 ENCOUNTER — Ambulatory Visit: Admitting: Physician Assistant

## 2025-01-13 ENCOUNTER — Ambulatory Visit: Admitting: Family Medicine

## 2025-01-19 ENCOUNTER — Ambulatory Visit: Admitting: Cardiology

## 2025-01-25 ENCOUNTER — Ambulatory Visit: Admitting: Cardiology

## 2025-03-02 ENCOUNTER — Ambulatory Visit: Admitting: Emergency Medicine

## 2025-04-27 ENCOUNTER — Ambulatory Visit: Admitting: Family Medicine
